# Patient Record
Sex: Female | Born: 1990 | Race: Black or African American | Hispanic: No | Marital: Single | Smoking: Never smoker
Health system: Southern US, Community
[De-identification: ages and names within clinical notes are randomized; demographics above are authoritative.]

## PROBLEM LIST (undated history)

## (undated) ENCOUNTER — Inpatient Hospital Stay (HOSPITAL_COMMUNITY): Payer: Self-pay

## (undated) DIAGNOSIS — E282 Polycystic ovarian syndrome: Secondary | ICD-10-CM

## (undated) DIAGNOSIS — Z789 Other specified health status: Secondary | ICD-10-CM

## (undated) DIAGNOSIS — J45909 Unspecified asthma, uncomplicated: Secondary | ICD-10-CM

## (undated) DIAGNOSIS — F419 Anxiety disorder, unspecified: Secondary | ICD-10-CM

## (undated) HISTORY — PX: WISDOM TOOTH EXTRACTION: SHX21

---

## 2010-11-03 ENCOUNTER — Emergency Department (HOSPITAL_COMMUNITY)
Admission: EM | Admit: 2010-11-03 | Discharge: 2010-11-03 | Disposition: A | Discharge status: Home / Self Care | Payer: PRIVATE HEALTH INSURANCE | Attending: Emergency Medicine | Admitting: Emergency Medicine

## 2010-11-03 ENCOUNTER — Emergency Department (HOSPITAL_COMMUNITY): Payer: PRIVATE HEALTH INSURANCE

## 2010-11-03 DIAGNOSIS — R109 Unspecified abdominal pain: Secondary | ICD-10-CM | POA: Insufficient documentation

## 2010-11-03 DIAGNOSIS — K5900 Constipation, unspecified: Secondary | ICD-10-CM | POA: Insufficient documentation

## 2010-11-03 DIAGNOSIS — E282 Polycystic ovarian syndrome: Secondary | ICD-10-CM | POA: Insufficient documentation

## 2010-11-03 LAB — DIFFERENTIAL
Basophils Absolute: 0 10*3/uL (ref 0.0–0.1)
Basophils Relative: 0 % (ref 0–1)
Eosinophils Absolute: 0.2 10*3/uL (ref 0.0–0.7)
Neutro Abs: 6.4 10*3/uL (ref 1.7–7.7)
Neutrophils Relative %: 58 % (ref 43–77)

## 2010-11-03 LAB — COMPREHENSIVE METABOLIC PANEL
ALT: 39 U/L — ABNORMAL HIGH (ref 0–35)
AST: 26 U/L (ref 0–37)
Albumin: 3.1 g/dL — ABNORMAL LOW (ref 3.5–5.2)
CO2: 26 mEq/L (ref 19–32)
Chloride: 105 mEq/L (ref 96–112)
GFR calc Af Amer: >60 mL/min (ref >60)
GFR calc non Af Amer: >60 mL/min (ref >60)
Sodium: 139 mEq/L (ref 135–145)
Total Bilirubin: 0.2 mg/dL — ABNORMAL LOW (ref 0.3–1.2)

## 2010-11-03 LAB — URINALYSIS, ROUTINE W REFLEX MICROSCOPIC
Bilirubin Urine: NEGATIVE
Hgb urine dipstick: NEGATIVE
Specific Gravity, Urine: 1.02 (ref 1.005–1.030)
Urine Glucose, Fasting: NEGATIVE mg/dL
Urobilinogen, UA: 0.2 mg/dL (ref 0.0–1.0)
pH: 7 (ref 5.0–8.0)

## 2010-11-03 LAB — WET PREP, GENITAL
Clue Cells Wet Prep HPF POC: NONE SEEN
Trich, Wet Prep: NONE SEEN

## 2010-11-03 LAB — CBC
Hemoglobin: 11.6 g/dL — ABNORMAL LOW (ref 12.0–15.0)
Platelets: 357 10*3/uL (ref 150–400)
RBC: 4.24 MIL/uL (ref 3.87–5.11)
WBC: 11 10*3/uL — ABNORMAL HIGH (ref 4.0–10.5)

## 2010-11-04 LAB — GC/CHLAMYDIA PROBE AMP, GENITAL: Chlamydia, DNA Probe: POSITIVE — AB

## 2010-11-09 ENCOUNTER — Other Ambulatory Visit: Payer: Self-pay | Admitting: Internal Medicine

## 2010-11-09 DIAGNOSIS — R102 Pelvic and perineal pain: Secondary | ICD-10-CM

## 2010-11-11 ENCOUNTER — Ambulatory Visit
Admission: RE | Admit: 2010-11-11 | Discharge: 2010-11-11 | Disposition: A | Discharge status: Home / Self Care | Payer: PRIVATE HEALTH INSURANCE | Source: Ambulatory Visit | Attending: Internal Medicine | Admitting: Internal Medicine

## 2010-11-11 ENCOUNTER — Other Ambulatory Visit: Payer: Self-pay | Admitting: Internal Medicine

## 2010-11-11 DIAGNOSIS — R102 Pelvic and perineal pain: Secondary | ICD-10-CM

## 2010-11-12 ENCOUNTER — Other Ambulatory Visit: Payer: PRIVATE HEALTH INSURANCE

## 2011-05-03 IMAGING — US US PELVIS COMPLETE
1 series · 13 of 25 positions shown · non-contrast
Comparison: None.

CLINICAL DATA: Irregular cycles for years.  On oral birth control.
LMP 09/15/2010.  Pelvic pain



[Series 1: us pelvis complete · 0.31mm/px · 13 of 46 slices shown]
[im 1/46]
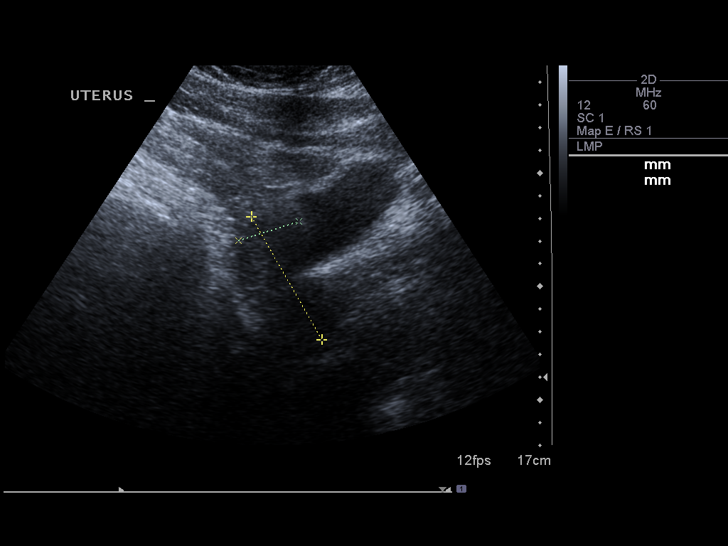
[im 4/46]
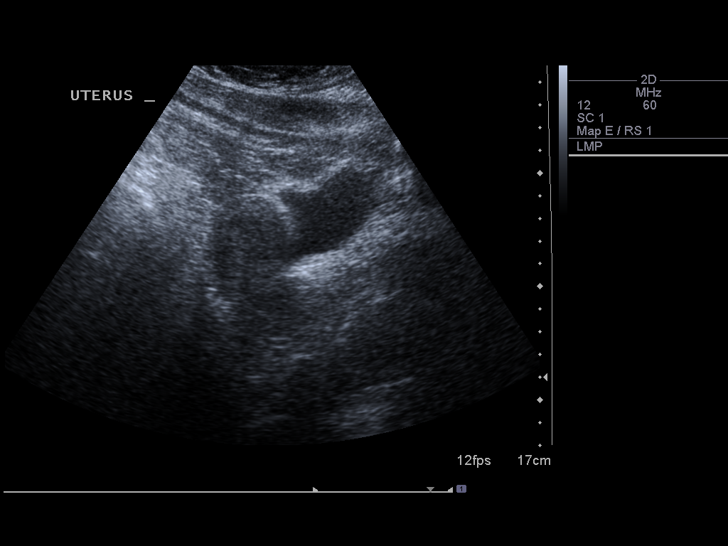
[im 8/46]
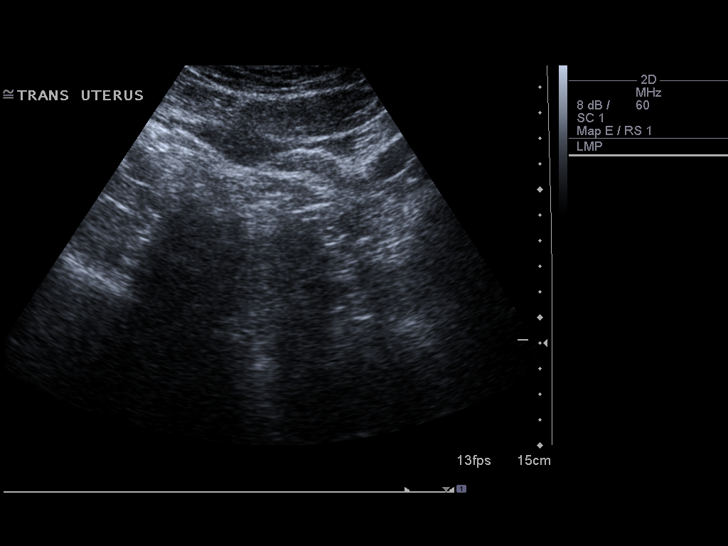
[im 12/46]
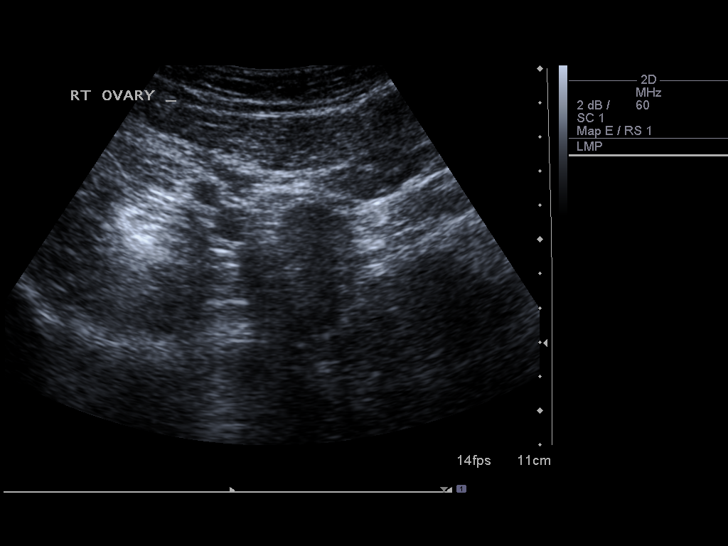
[im 16/46]
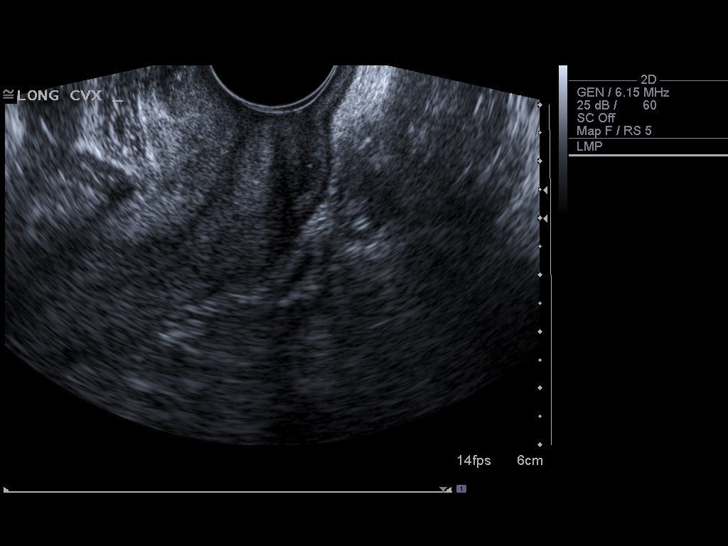
[im 19/46]
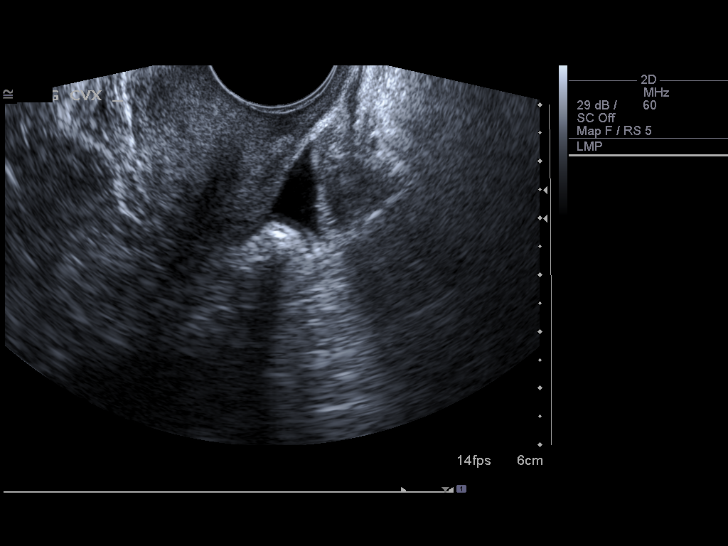
[im 23/46]
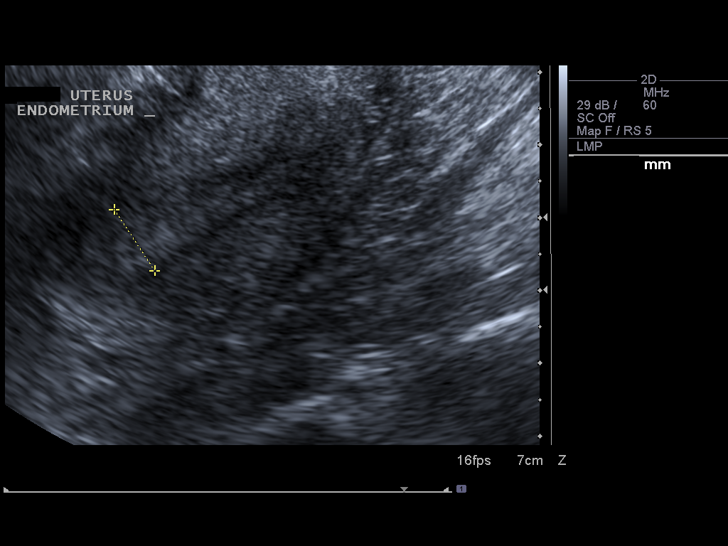
[im 27/46]
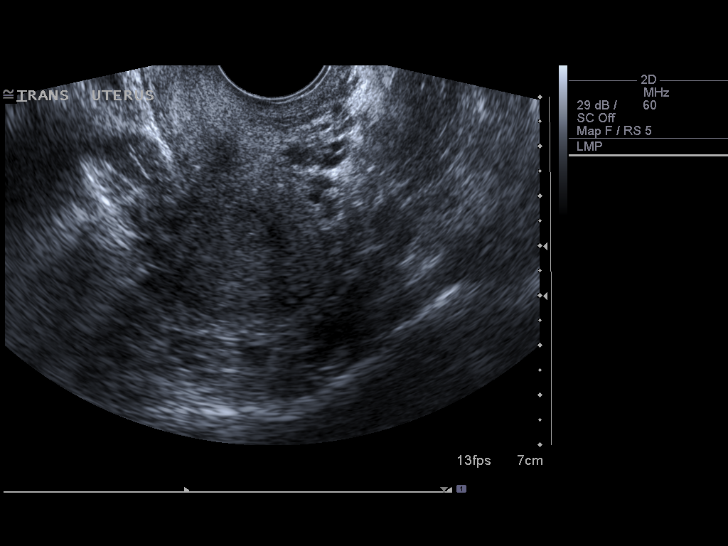
[im 31/46]
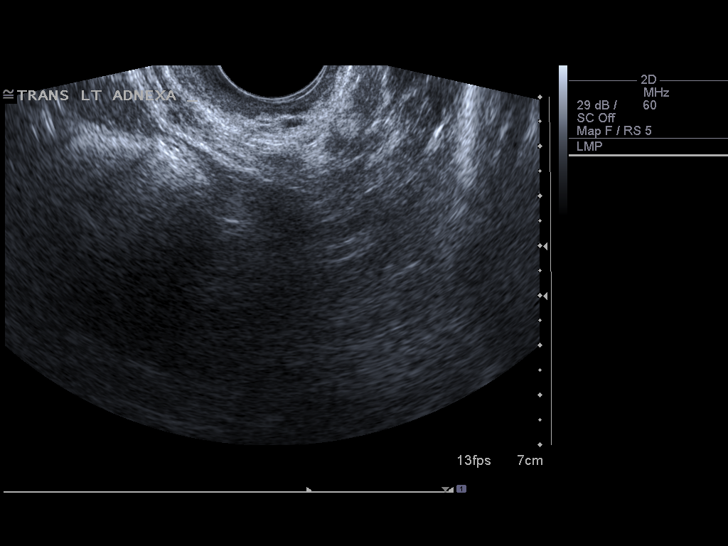
[im 34/46]
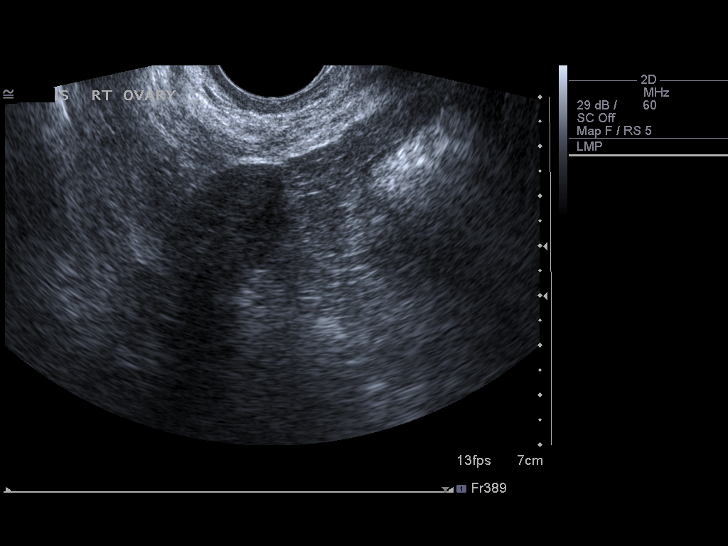
[im 38/46]
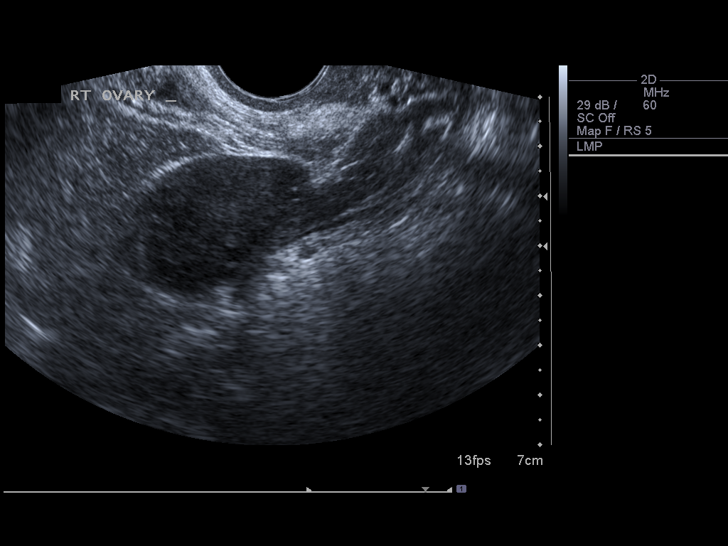
[im 42/46]
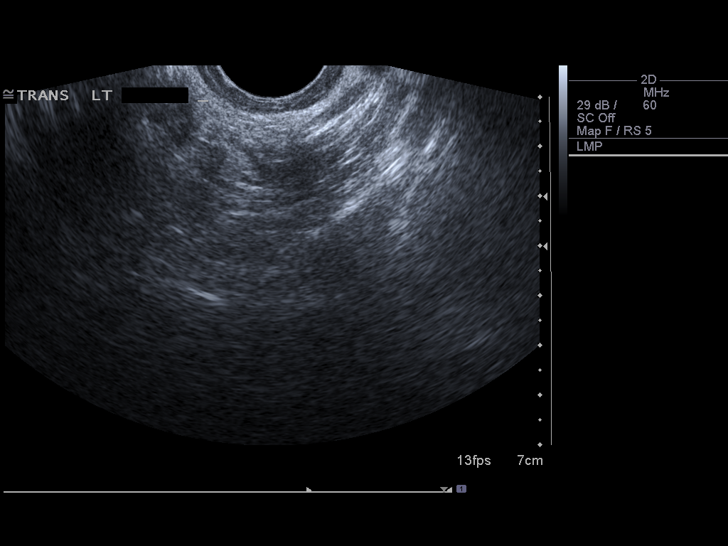
[im 46/46]
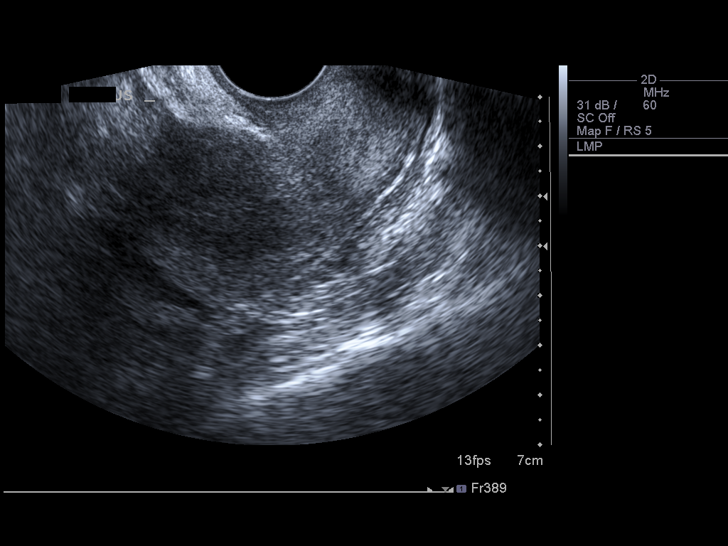

[13 of 25 positions shown; findings below may reference images not displayed]

FINDINGS: Uterus the uterus has a sagittal length of 6.0 cm, an AP depth of
2.8 cm and a transverse width of 5.1 cm.  A homogeneous uterine
myometrium is seen.

Endometrium

The endometrial stripe is homogeneously echogenic with an AP width
of 10.1 mm.  This would correlate with a presecretory endometrial
stripe and correspond with the given LMP of 09/15/2010.  No focal
thickening or areas of inhomogeneity are noted.

Right Ovary measures 4.3 x 2.3 by 2.2 cm and contains a few
scattered sub centimeter follicles.

Left Ovary is not seen with confidence either transabdominally or
endovaginally.

Other Findings:  No pelvic fluid or separate adnexal masses are
seen.
IMPRESSION: Non-visualized left ovary.  Otherwise unremarkable pelvic
ultrasound.

## 2011-12-19 ENCOUNTER — Emergency Department (HOSPITAL_COMMUNITY)
Admission: EM | Admit: 2011-12-19 | Discharge: 2011-12-19 | Disposition: A | Discharge status: Home / Self Care | Payer: PRIVATE HEALTH INSURANCE | Attending: Emergency Medicine | Admitting: Emergency Medicine

## 2011-12-19 ENCOUNTER — Emergency Department (HOSPITAL_COMMUNITY): Payer: PRIVATE HEALTH INSURANCE

## 2011-12-19 ENCOUNTER — Encounter (HOSPITAL_COMMUNITY): Payer: Self-pay

## 2011-12-19 DIAGNOSIS — R1011 Right upper quadrant pain: Secondary | ICD-10-CM | POA: Insufficient documentation

## 2011-12-19 DIAGNOSIS — R112 Nausea with vomiting, unspecified: Secondary | ICD-10-CM | POA: Insufficient documentation

## 2011-12-19 DIAGNOSIS — R197 Diarrhea, unspecified: Secondary | ICD-10-CM | POA: Insufficient documentation

## 2011-12-19 LAB — COMPREHENSIVE METABOLIC PANEL
ALT: 63 U/L — ABNORMAL HIGH (ref 0–35)
Alkaline Phosphatase: 82 U/L (ref 39–117)
BUN: 9 mg/dL (ref 6–23)
CO2: 25 mEq/L (ref 19–32)
GFR calc Af Amer: >90 mL/min (ref >90)
GFR calc non Af Amer: >90 mL/min (ref >90)
Glucose, Bld: 95 mg/dL (ref 70–99)
Potassium: 3.9 mEq/L (ref 3.5–5.1)
Sodium: 136 mEq/L (ref 135–145)
Total Bilirubin: 0.3 mg/dL (ref 0.3–1.2)

## 2011-12-19 LAB — CBC
HCT: 37.2 % (ref 36.0–46.0)
Hemoglobin: 12.5 g/dL (ref 12.0–15.0)
MCHC: 33.6 g/dL (ref 30.0–36.0)
RBC: 4.44 MIL/uL (ref 3.87–5.11)

## 2011-12-19 LAB — URINALYSIS, ROUTINE W REFLEX MICROSCOPIC
Bilirubin Urine: NEGATIVE
Glucose, UA: NEGATIVE mg/dL
Ketones, ur: NEGATIVE mg/dL
Nitrite: NEGATIVE
Specific Gravity, Urine: 1.021 (ref 1.005–1.030)
pH: 6.5 (ref 5.0–8.0)

## 2011-12-19 LAB — LIPASE, BLOOD: Lipase: 36 U/L (ref 11–59)

## 2011-12-19 LAB — URINE MICROSCOPIC-ADD ON

## 2011-12-19 MED ORDER — SODIUM CHLORIDE 0.9 % IV BOLUS (SEPSIS)
1000.0000 mL | Freq: Once | INTRAVENOUS | Status: AC
  Administered 2011-12-19: 1000 mL via INTRAVENOUS

## 2011-12-19 MED ORDER — MORPHINE SULFATE 4 MG/ML IJ SOLN
4.0000 mg | Freq: Once | INTRAMUSCULAR | Status: AC
  Administered 2011-12-19: 4 mg via INTRAVENOUS
  Filled 2011-12-19: qty 1

## 2011-12-19 MED ORDER — ONDANSETRON HCL 4 MG PO TABS: 4.0000 mg | ORAL_TABLET | Freq: Four times a day (QID) | ORAL | Status: AC

## 2011-12-19 MED ORDER — ONDANSETRON HCL 4 MG/2ML IJ SOLN
4.0000 mg | Freq: Once | INTRAMUSCULAR | Status: AC
  Administered 2011-12-19: 4 mg via INTRAVENOUS
  Filled 2011-12-19: qty 2

## 2011-12-19 MED ORDER — RANITIDINE HCL 150 MG PO TABS: 150.0000 mg | ORAL_TABLET | Freq: Two times a day (BID) | ORAL | Status: AC

## 2011-12-19 NOTE — ED Notes (Signed)
EDP at bedside speaking with pt and family 

## 2011-12-19 NOTE — ED Provider Notes (Signed)
History     CSN: [id number]  Arrival date & time 12/19/11  2536   First MD Initiated Contact with Patient 12/19/11 [phone]      Chief Complaint  Patient presents with  . Abdominal Pain    (Consider location/radiation/quality/duration/timing/severity/associated sxs/prior treatment) HPI Pt presents with c/o right upper abdominal pain with associated vomiting and diarrhea.  Symptoms began 2 nights ago, RUQ pain worsened las night.  No fever/chills.  Pt states this morning she has not been able to keep down liquids.  Denies dysuria, no vaginal discharge.  No pain in pelvis or lower abdomen.  There are no other associated systemic symptoms.  There are no alleviating or modifying factors.  Pt does not know of any specific sick contacts.   History reviewed. No pertinent past medical history.  History reviewed. No pertinent past surgical history.  History reviewed. No pertinent family history.  History  Substance Use Topics  . Smoking status: Never Smoker   . Smokeless tobacco: Not on file  . Alcohol Use: Yes     occasional    OB History    Grav Para Term Preterm Abortions TAB SAB Ect Mult Living   0               Review of Systems ROS reviewed and otherwise negative except for mentioned in HPI  Allergies  Eggs or egg-derived products  Home Medications   Current Outpatient Rx  Name Route Sig Dispense Refill  . ADULT MULTIVITAMIN W/MINERALS CH Oral Take 1 tablet by mouth daily.    Marland Kitchen NORGESTIMATE-ETH ESTRADIOL 0.25-35 MG-MCG PO TABS Oral Take 1 tablet by mouth daily.    Marland Kitchen ONDANSETRON HCL 4 MG PO TABS Oral Take 1 tablet (4 mg total) by mouth every 6 (six) hours. 12 tablet 0  . PHENTERMINE HCL 37.5 MG PO CAPS Oral Take 37.5 mg by mouth every morning.    Marland Kitchen RANITIDINE HCL 150 MG PO TABS Oral Take 1 tablet (150 mg total) by mouth 2 (two) times daily. 60 tablet 0    BP 100/46  Pulse 84  Temp(Src) 98 F (36.7 C) (Oral)  Resp 20  SpO2 99%  LMP 09/17/2011 Vitals  reviewed Physical Exam Physical Examination: General appearance - alert, well appearing, and in no distress Mental status - alert, oriented to person, place, and time Eyes - pupils equal and reactive, extraocular eye movements intact Mouth - mucous membranes moist, pharynx normal without lesions Chest - clear to auscultation, no wheezes, rales or rhonchi, symmetric air entry Heart - normal rate, regular rhythm, normal S1, S2, no murmurs, rubs, clicks or gallops Abdomen - soft, mild ttp in epigastrium and RUQ, no gaurding or rebound tenderness, nondistended, no masses or organomegaly, nabs Extremities - peripheral pulses normal, no pedal edema, no clubbing or cyanosis Skin - normal coloration and turgor, no rashes  ED Course  Procedures (including critical care time)  11:46 AM pt tolerating fluids and requesting food.  Will discharge home.   Labs Reviewed  URINALYSIS, ROUTINE W REFLEX MICROSCOPIC - Abnormal; Notable for the following:    APPearance CLOUDY (*)    Hgb urine dipstick TRACE (*)    Leukocytes, UA MODERATE (*)    All other components within normal limits  COMPREHENSIVE METABOLIC PANEL - Abnormal; Notable for the following:    Albumin 3.1 (*)    AST 39 (*)    ALT 63 (*)    All other components within normal limits  URINE MICROSCOPIC-ADD ON - Abnormal; Notable  for the following:    Squamous Epithelial / LPF FEW (*)    Bacteria, UA FEW (*)    All other components within normal limits  CBC  LIPASE, BLOOD  POCT PREGNANCY, URINE  LAB REPORT - SCANNED   No results found.   1. Abdominal pain   2. Nausea vomiting and diarrhea       MDM  Pt presenting with c/o RUQ abdominal pain with nausea/vomiting/diarrhea.  Ultrasound reveals no gallstones or cholecystitis.  Pt feels improved after IV hydration and antiemetics.  I have a low suspicion for other acute intraabdominal process at this time.   Suspect gastroenteritis.  Pt discharged with strict return precautions, she is  agreeable with this plan.         Ethelda Chick, MD 12/21/11 [phone]

## 2011-12-19 NOTE — Discharge Instructions (Signed)
Return to the ED with any concerns including vomiting and not able to keep down liquids, worsening abdominal pain, fever/chills, or any other alarming symptoms

## 2011-12-19 NOTE — ED Notes (Signed)
Pt reports abdominal pain after drinking sprite, no vomiting.

## 2011-12-19 NOTE — ED Notes (Signed)
Pt with c/o pain in the right abdomen, had nausea, vomiting and diarrhea Saturday, Right abdominal pain worsened Saturday night

## 2012-06-09 IMAGING — US US ABDOMEN COMPLETE
1 series · 13 of 25 positions shown · non-contrast
Comparison: Right upper quadrant abdominal ultrasound 01/14/2009
Shaiene Gazola.

CLINICAL DATA: Right upper quadrant abdominal pain.  Nausea and
vomiting.

COMPLETE ABDOMINAL ULTRASOUND 12/19/2011:

[Series 1: us abdomen complete · 0.36mm/px · 13 of 51 slices shown]
[im 1/51]
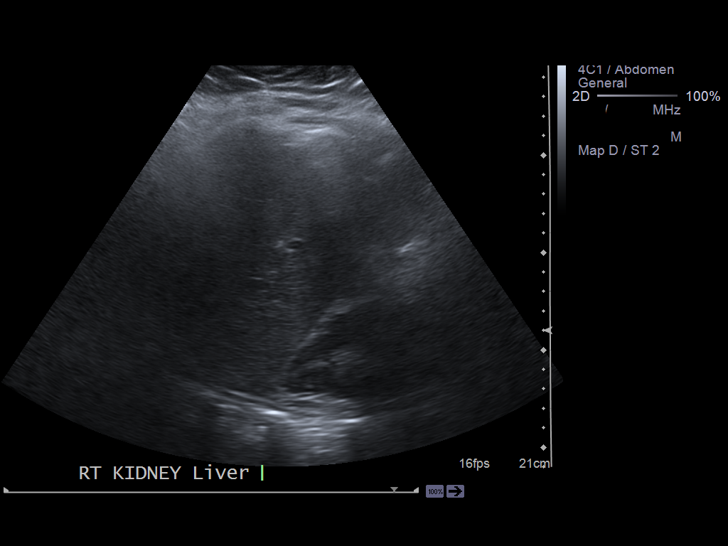
[im 5/51]
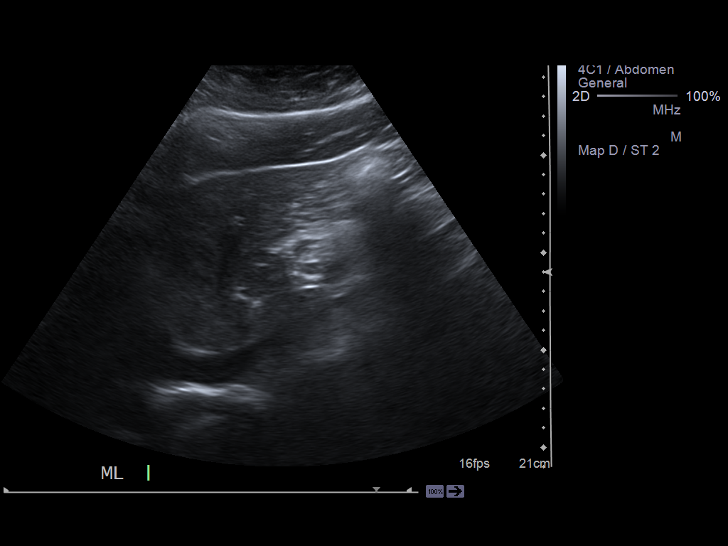
[im 9/51]
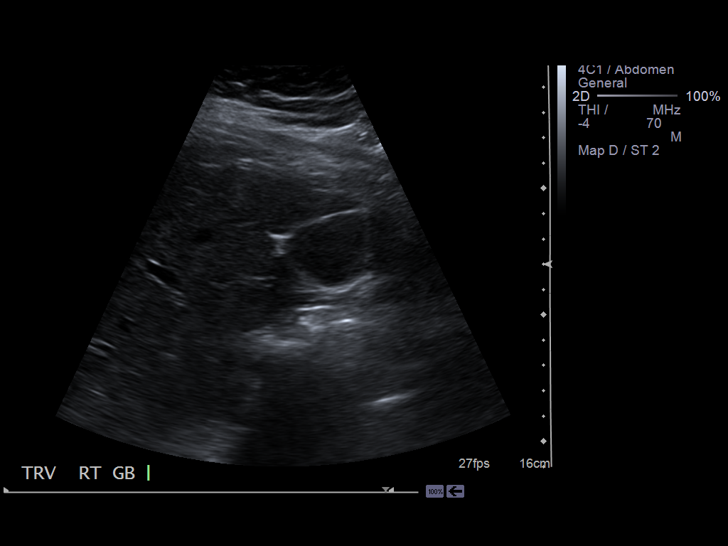
[im 13/51]
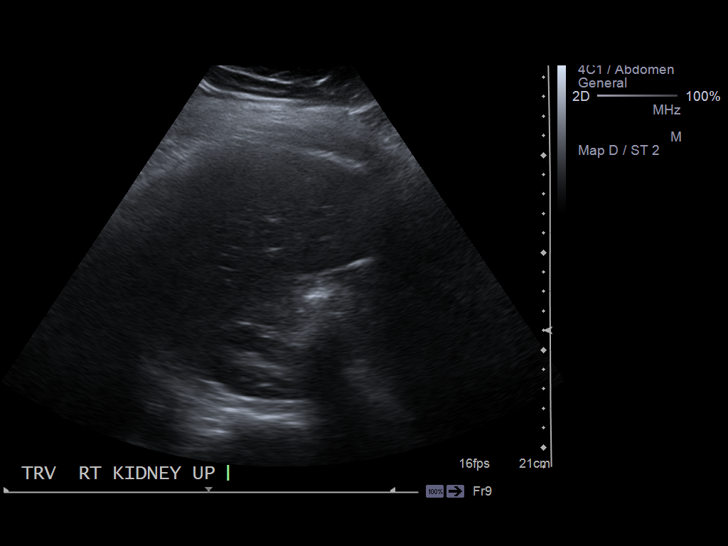
[im 17/51]
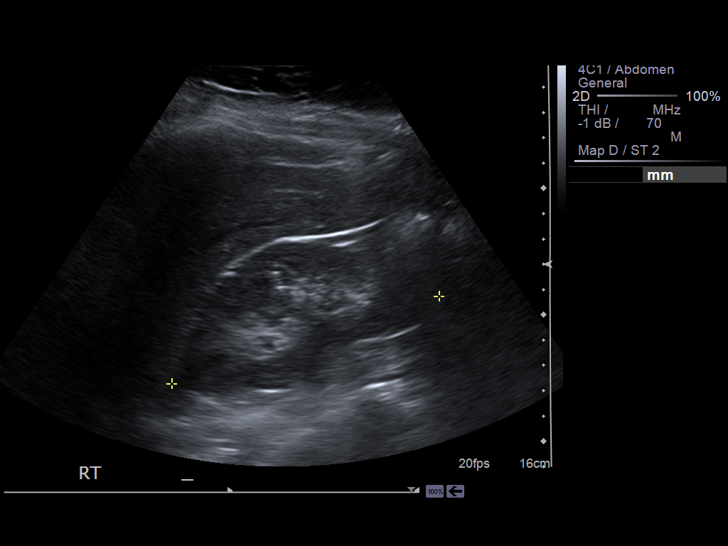
[im 21/51]
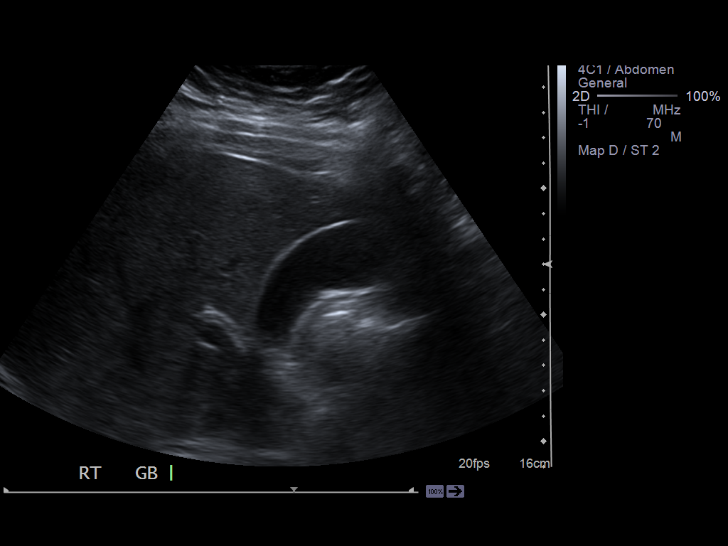
[im 26/51]
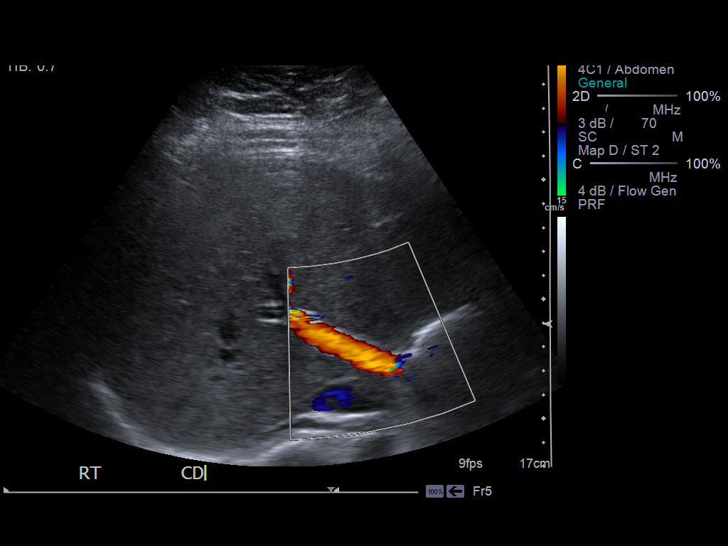
[im 30/51]
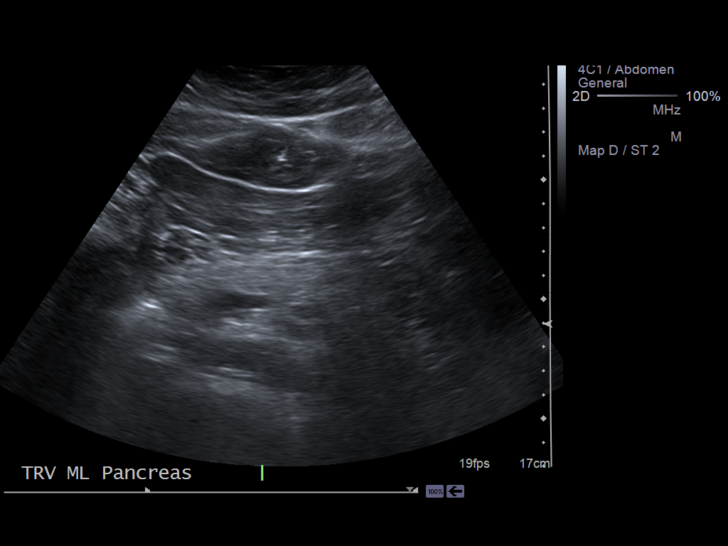
[im 34/51]
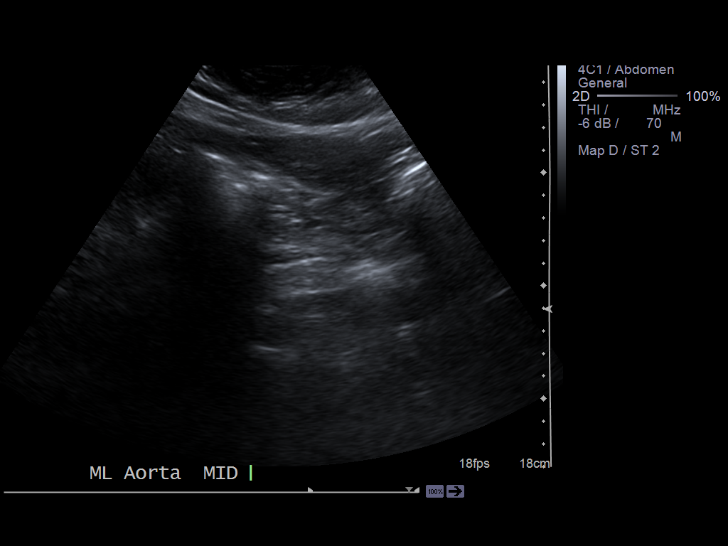
[im 38/51]
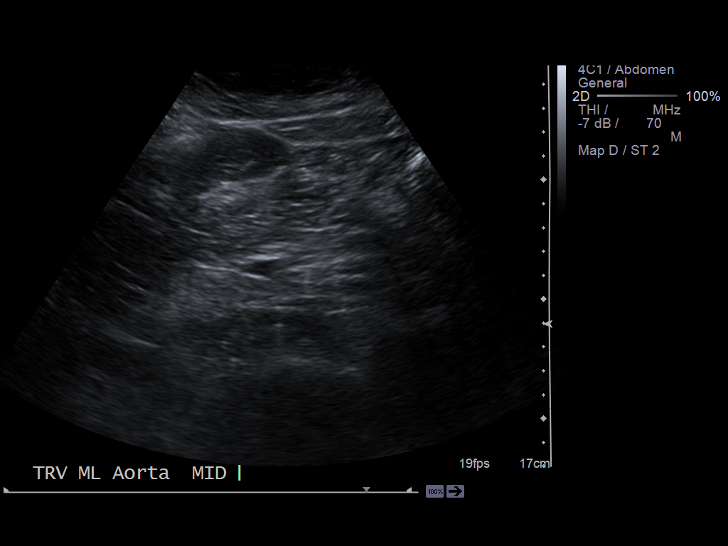
[im 42/51]
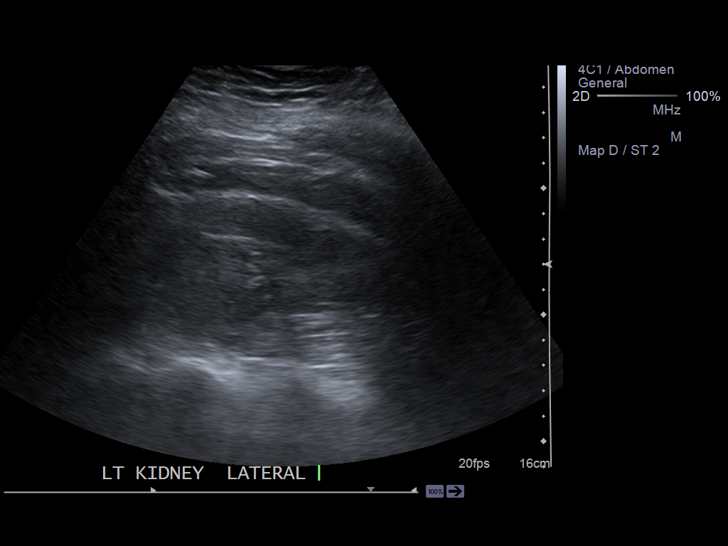
[im 46/51]
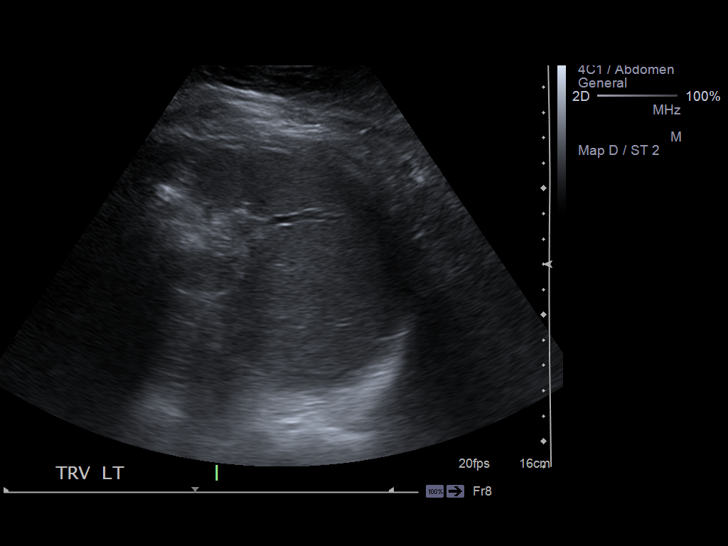
[im 51/51]
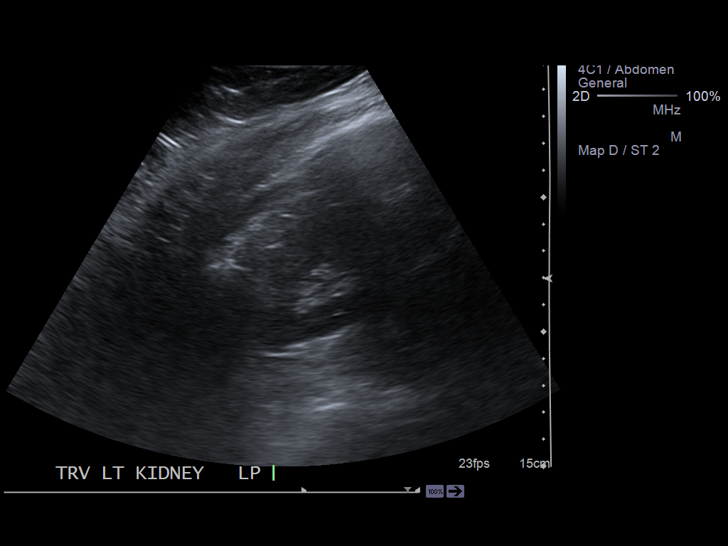

[13 of 25 positions shown; findings below may reference images not displayed]

FINDINGS: Gallbladder:  No shadowing gallstones or echogenic sludge.  No
gallbladder wall thickening or pericholecystic fluid.  Negative
sonographic Murphy's sign according to the ultrasound technologist.

Common bile duct:  Normal in caliber with maximum diameter
approximating 4 mm.

Liver:  Normal size and echotexture without focal parenchymal
abnormality.  Patent portal vein with hepatopetal flow.

IVC:  Patent.

Pancreas:  Although the pancreas is difficult to visualize in its
entirety, no focal pancreatic abnormality is identified.

Spleen:  Normal size and echotexture without focal parenchymal
abnormality.

Right Kidney:  No hydronephrosis.  Well-preserved cortex.  No
shadowing calculi.  Normal size and parenchymal echotexture without
focal abnormalities.  Approximately 11.1 cm in length.

Left Kidney:  No hydronephrosis.  Well-preserved cortex.  No
shadowing calculi.  Normal size and parenchymal echotexture without
focal abnormalities.  Approximate 11.7 cm in length.

Abdominal aorta:  Normal in caliber throughout its visualized
course in the abdomen without significant atherosclerosis.
IMPRESSION: Normal examination.

## 2012-09-27 HISTORY — PX: LAPAROSCOPIC GASTRIC SLEEVE RESECTION: SHX5895

## 2013-03-29 DIAGNOSIS — G43909 Migraine, unspecified, not intractable, without status migrainosus: Secondary | ICD-10-CM | POA: Insufficient documentation

## 2013-03-29 DIAGNOSIS — R030 Elevated blood-pressure reading, without diagnosis of hypertension: Secondary | ICD-10-CM | POA: Insufficient documentation

## 2014-04-30 DIAGNOSIS — K910 Vomiting following gastrointestinal surgery: Secondary | ICD-10-CM | POA: Insufficient documentation

## 2014-04-30 DIAGNOSIS — Z903 Acquired absence of stomach [part of]: Secondary | ICD-10-CM | POA: Insufficient documentation

## 2014-05-09 DIAGNOSIS — E519 Thiamine deficiency, unspecified: Secondary | ICD-10-CM | POA: Insufficient documentation

## 2015-09-28 NOTE — L&D Delivery Note (Addendum)
Operative Delivery Note At 10:21 AM a viable female was delivered via Vaginal, Vacuum Investment banker, operational(Extractor).  Presentation: LOA; Position: Left,, Occiput,, Anterior; Station: +3. Pt was prepped and draped for CS.  She had bloody urine so I checked her.  I offered her VAVD because her epidural had just been dosed.   one pull used.  No pop offs occurred.  The pressure was maintained in the green zone.    Verbal consent: obtained from patient.  Risks and benefits discussed in detail.  Risks include, but are not limited to the risks of anesthesia, bleeding, infection, damage to maternal tissues, fetal cephalhematoma.  There is also the risk of inability to effect vaginal delivery of the head, or shoulder dystocia that cannot be resolved by established maneuvers, leading to the need for emergency cesarean section.  APGAR: , ; weight  .   Placenta status: , .   Cord:  with the following complications: .  Cord pH: 7.34 and 7.35  Anesthesia:  Epidural Instruments: vacuum Episiotomy:   Lacerations:  Second degree Suture Repair: 2.0 chromic and 0 vicryl Est. Blood Loss (mL):  300 cc  Mom to postpartum.  Baby to Couplet care / Skin to Skin.  Krista Solis A 06/12/2016, 11:05 AM

## 2015-10-27 ENCOUNTER — Inpatient Hospital Stay (HOSPITAL_COMMUNITY): Payer: Medicaid Other

## 2015-10-27 ENCOUNTER — Inpatient Hospital Stay (HOSPITAL_COMMUNITY)
Admission: AD | Admit: 2015-10-27 | Discharge: 2015-10-27 | Disposition: A | Discharge status: Home / Self Care | Payer: Medicaid Other | Source: Ambulatory Visit | Attending: Family Medicine | Admitting: Family Medicine

## 2015-10-27 ENCOUNTER — Encounter (HOSPITAL_COMMUNITY): Payer: Self-pay | Admitting: *Deleted

## 2015-10-27 DIAGNOSIS — Z331 Pregnant state, incidental: Secondary | ICD-10-CM

## 2015-10-27 DIAGNOSIS — Z3490 Encounter for supervision of normal pregnancy, unspecified, unspecified trimester: Secondary | ICD-10-CM

## 2015-10-27 DIAGNOSIS — O4691 Antepartum hemorrhage, unspecified, first trimester: Secondary | ICD-10-CM | POA: Diagnosis not present

## 2015-10-27 DIAGNOSIS — R109 Unspecified abdominal pain: Secondary | ICD-10-CM | POA: Diagnosis not present

## 2015-10-27 DIAGNOSIS — O9989 Other specified diseases and conditions complicating pregnancy, childbirth and the puerperium: Secondary | ICD-10-CM | POA: Diagnosis not present

## 2015-10-27 DIAGNOSIS — O26899 Other specified pregnancy related conditions, unspecified trimester: Secondary | ICD-10-CM

## 2015-10-27 DIAGNOSIS — Z3A01 Less than 8 weeks gestation of pregnancy: Secondary | ICD-10-CM | POA: Insufficient documentation

## 2015-10-27 DIAGNOSIS — O209 Hemorrhage in early pregnancy, unspecified: Secondary | ICD-10-CM

## 2015-10-27 HISTORY — DX: Polycystic ovarian syndrome: E28.2

## 2015-10-27 LAB — CBC
HCT: 37.9 % (ref 36.0–46.0)
Hemoglobin: 12.6 g/dL (ref 12.0–15.0)
MCH: 29 pg (ref 26.0–34.0)
MCHC: 33.2 g/dL (ref 30.0–36.0)
MCV: 87.1 fL (ref 78.0–100.0)
PLATELETS: 288 10*3/uL (ref 150–400)
RBC: 4.35 MIL/uL (ref 3.87–5.11)
RDW: 12.6 % (ref 11.5–15.5)
WBC: 5.9 10*3/uL (ref 4.0–10.5)

## 2015-10-27 LAB — URINALYSIS, ROUTINE W REFLEX MICROSCOPIC
BILIRUBIN URINE: NEGATIVE
Glucose, UA: NEGATIVE mg/dL
KETONES UR: NEGATIVE mg/dL
Leukocytes, UA: NEGATIVE
NITRITE: NEGATIVE
Protein, ur: NEGATIVE mg/dL
Specific Gravity, Urine: <1.005 — ABNORMAL LOW (ref 1.005–1.030)
pH: 7 (ref 5.0–8.0)

## 2015-10-27 LAB — OB RESULTS CONSOLE GC/CHLAMYDIA: GC PROBE AMP, GENITAL: NEGATIVE

## 2015-10-27 LAB — HCG, QUANTITATIVE, PREGNANCY: hCG, Beta Chain, Quant, S: 49616 m[IU]/mL — ABNORMAL HIGH (ref <5)

## 2015-10-27 LAB — ABO/RH: ABO/RH(D): B POS

## 2015-10-27 LAB — POCT PREGNANCY, URINE: PREG TEST UR: POSITIVE — AB

## 2015-10-27 LAB — WET PREP, GENITAL
Clue Cells Wet Prep HPF POC: NONE SEEN
SPERM: NONE SEEN
Trich, Wet Prep: NONE SEEN
YEAST WET PREP: NONE SEEN

## 2015-10-27 LAB — URINE MICROSCOPIC-ADD ON

## 2015-10-27 NOTE — Discharge Instructions (Signed)
First Trimester of Pregnancy °The first trimester of pregnancy is from week 1 until the end of week 12 (months 1 through 3). A week after a sperm fertilizes an egg, the egg will implant on the wall of the uterus. This embryo will begin to develop into a baby. Genes from you and your partner are forming the baby. The female genes determine whether the baby is a boy or a girl. At 6-8 weeks, the eyes and face are formed, and the heartbeat can be seen on ultrasound. At the end of 12 weeks, all the baby's organs are formed.  °Now that you are pregnant, you will want to do everything you can to have a healthy baby. Two of the most important things are to get good prenatal care and to follow your health care provider's instructions. Prenatal care is all the medical care you receive before the baby's birth. This care will help prevent, find, and treat any problems during the pregnancy and childbirth. °BODY CHANGES °Your body goes through many changes during pregnancy. The changes vary from woman to woman.  °· You may gain or lose a couple of pounds at first. °· You may feel sick to your stomach (nauseous) and throw up (vomit). If the vomiting is uncontrollable, call your health care provider. °· You may tire easily. °· You may develop headaches that can be relieved by medicines approved by your health care provider. °· You may urinate more often. Painful urination may mean you have a bladder infection. °· You may develop heartburn as a result of your pregnancy. °· You may develop constipation because certain hormones are causing the muscles that push waste through your intestines to slow down. °· You may develop hemorrhoids or swollen, bulging veins (varicose veins). °· Your breasts may begin to grow larger and become tender. Your nipples may stick out more, and the tissue that surrounds them (areola) may become darker. °· Your gums may bleed and may be sensitive to brushing and flossing. °· Dark spots or blotches (chloasma,  mask of pregnancy) may develop on your face. This will likely fade after the baby is born. °· Your menstrual periods will stop. °· You may have a loss of appetite. °· You may develop cravings for certain kinds of food. °· You may have changes in your emotions from day to day, such as being excited to be pregnant or being concerned that something may go wrong with the pregnancy and baby. °· You may have more vivid and strange dreams. °· You may have changes in your hair. These can include thickening of your hair, rapid growth, and changes in texture. Some women also have hair loss during or after pregnancy, or hair that feels dry or thin. Your hair will most likely return to normal after your baby is born. °WHAT TO EXPECT AT YOUR PRENATAL VISITS °During a routine prenatal visit: °· You will be weighed to make sure you and the baby are growing normally. °· Your blood pressure will be taken. °· Your abdomen will be measured to track your baby's growth. °· The fetal heartbeat will be listened to starting around week 10 or 12 of your pregnancy. °· Test results from any previous visits will be discussed. °Your health care provider may ask you: °· How you are feeling. °· If you are feeling the baby move. °· If you have had any abnormal symptoms, such as leaking fluid, bleeding, severe headaches, or abdominal cramping. °· If you are using any tobacco products,   including cigarettes, chewing tobacco, and electronic cigarettes. °· If you have any questions. °Other tests that may be performed during your first trimester include: °· Blood tests to find your blood type and to check for the presence of any previous infections. They will also be used to check for low iron levels (anemia) and Rh antibodies. Later in the pregnancy, blood tests for diabetes will be done along with other tests if problems develop. °· Urine tests to check for infections, diabetes, or protein in the urine. °· An ultrasound to confirm the proper growth  and development of the baby. °· An amniocentesis to check for possible genetic problems. °· Fetal screens for spina bifida and Down syndrome. °· You may need other tests to make sure you and the baby are doing well. °· HIV (human immunodeficiency virus) testing. Routine prenatal testing includes screening for HIV, unless you choose not to have this test. °HOME CARE INSTRUCTIONS  °Medicines °· Follow your health care provider's instructions regarding medicine use. Specific medicines may be either safe or unsafe to take during pregnancy. °· Take your prenatal vitamins as directed. °· If you develop constipation, try taking a stool softener if your health care provider approves. °Diet °· Eat regular, well-balanced meals. Choose a variety of foods, such as meat or vegetable-based protein, fish, milk and low-fat dairy products, vegetables, fruits, and whole grain breads and cereals. Your health care provider will help you determine the amount of weight gain that is right for you. °· Avoid raw meat and uncooked cheese. These carry germs that can cause birth defects in the baby. °· Eating four or five small meals rather than three large meals a day may help relieve nausea and vomiting. If you start to feel nauseous, eating a few soda crackers can be helpful. Drinking liquids between meals instead of during meals also seems to help nausea and vomiting. °· If you develop constipation, eat more high-fiber foods, such as fresh vegetables or fruit and whole grains. Drink enough fluids to keep your urine clear or pale yellow. °Activity and Exercise °· Exercise only as directed by your health care provider. Exercising will help you: °¨ Control your weight. °¨ Stay in shape. °¨ Be prepared for labor and delivery. °· Experiencing pain or cramping in the lower abdomen or low back is a good sign that you should stop exercising. Check with your health care provider before continuing normal exercises. °· Try to avoid standing for long  periods of time. Move your legs often if you must stand in one place for a long time. °· Avoid heavy lifting. °· Wear low-heeled shoes, and practice good posture. °· You may continue to have sex unless your health care provider directs you otherwise. °Relief of Pain or Discomfort °· Wear a good support bra for breast tenderness.   °· Take warm sitz baths to soothe any pain or discomfort caused by hemorrhoids. Use hemorrhoid cream if your health care provider approves.   °· Rest with your legs elevated if you have leg cramps or low back pain. °· If you develop varicose veins in your legs, wear support hose. Elevate your feet for 15 minutes, 3-4 times a day. Limit salt in your diet. °Prenatal Care °· Schedule your prenatal visits by the twelfth week of pregnancy. They are usually scheduled monthly at first, then more often in the last 2 months before delivery. °· Write down your questions. Take them to your prenatal visits. °· Keep all your prenatal visits as directed by your   health care provider. °Safety °· Wear your seat belt at all times when driving. °· Make a list of emergency phone numbers, including numbers for family, friends, the hospital, and police and fire departments. °General Tips °· Ask your health care provider for a referral to a local prenatal education class. Begin classes no later than at the beginning of month 6 of your pregnancy. °· Ask for help if you have counseling or nutritional needs during pregnancy. Your health care provider can offer advice or refer you to specialists for help with various needs. °· Do not use hot tubs, steam rooms, or saunas. °· Do not douche or use tampons or scented sanitary pads. °· Do not cross your legs for long periods of time. °· Avoid cat litter boxes and soil used by cats. These carry germs that can cause birth defects in the baby and possibly loss of the fetus by miscarriage or stillbirth. °· Avoid all smoking, herbs, alcohol, and medicines not prescribed by  your health care provider. Chemicals in these affect the formation and growth of the baby. °· Do not use any tobacco products, including cigarettes, chewing tobacco, and electronic cigarettes. If you need help quitting, ask your health care provider. You may receive counseling support and other resources to help you quit. °· Schedule a dentist appointment. At home, brush your teeth with a soft toothbrush and be gentle when you floss. °SEEK MEDICAL CARE IF:  °· You have dizziness. °· You have mild pelvic cramps, pelvic pressure, or nagging pain in the abdominal area. °· You have persistent nausea, vomiting, or diarrhea. °· You have a bad smelling vaginal discharge. °· You have pain with urination. °· You notice increased swelling in your face, hands, legs, or ankles. °SEEK IMMEDIATE MEDICAL CARE IF:  °· You have a fever. °· You are leaking fluid from your vagina. °· You have spotting or bleeding from your vagina. °· You have severe abdominal cramping or pain. °· You have rapid weight gain or loss. °· You vomit blood or material that looks like coffee grounds. °· You are exposed to German measles and have never had them. °· You are exposed to fifth disease or chickenpox. °· You develop a severe headache. °· You have shortness of breath. °· You have any kind of trauma, such as from a fall or a car accident. °  °This information is not intended to replace advice given to you by your health care provider. Make sure you discuss any questions you have with your health care provider. °  °Document Released: 09/07/2001 Document Revised: 10/04/2014 Document Reviewed: 07/24/2013 °Elsevier Interactive Patient Education ©2016 [employer] °Vaginal Bleeding During Pregnancy, First Trimester °A small amount of bleeding (spotting) from the vagina is relatively common in early pregnancy. It usually stops on its own. Various things may cause bleeding or spotting in early pregnancy. Some bleeding may be related to the pregnancy, and  some may not. In most cases, the bleeding is normal and is not a problem. However, bleeding can also be a sign of something serious. Be sure to tell your health care provider about any vaginal bleeding right away. °Some possible causes of vaginal bleeding during the first trimester include: °· Infection or inflammation of the cervix. °· Growths (polyps) on the cervix. °· Miscarriage or threatened miscarriage. °· Pregnancy tissue has developed outside of the uterus and in a fallopian tube (tubal pregnancy). °· Tiny cysts have developed in the uterus instead of pregnancy tissue (molar pregnancy). °HOME CARE INSTRUCTIONS  °  Watch your condition for any changes. The following actions may help to lessen any discomfort you are feeling: °· Follow your health care provider's instructions for limiting your activity. If your health care provider orders bed rest, you may need to stay in bed and only get up to use the bathroom. However, your health care provider may allow you to continue light activity. °· If needed, make plans for someone to help with your regular activities and responsibilities while you are on bed rest. °· Keep track of the number of pads you use each day, how often you change pads, and how soaked (saturated) they are. Write this down. °· Do not use tampons. Do not douche. °· Do not have sexual intercourse or orgasms until approved by your health care provider. °· If you pass any tissue from your vagina, save the tissue so you can show it to your health care provider. °· Only take over-the-counter or prescription medicines as directed by your health care provider. °· Do not take aspirin because it can make you bleed. °· Keep all follow-up appointments as directed by your health care provider. °SEEK MEDICAL CARE IF: °· You have any vaginal bleeding during any part of your pregnancy. °· You have cramps or labor pains. °· You have a fever, not controlled by medicine. °SEEK IMMEDIATE MEDICAL CARE IF:  °· You have  severe cramps in your back or belly (abdomen). °· You pass large clots or tissue from your vagina. °· Your bleeding increases. °· You feel light-headed or weak, or you have fainting episodes. °· You have chills. °· You are leaking fluid or have a gush of fluid from your vagina. °· You pass out while having a bowel movement. °MAKE SURE YOU: °· Understand these instructions. °· Will watch your condition. °· Will get help right away if you are not doing well or get worse. °  °This information is not intended to replace advice given to you by your health care provider. Make sure you discuss any questions you have with your health care provider. °  °Document Released: 06/23/2005 Document Revised: 09/18/2013 Document Reviewed: 05/21/2013 °Elsevier Interactive Patient Education ©2016 [employer] ° °

## 2015-10-27 NOTE — MAU Note (Signed)
preg confirmed at [facility] on Sat

## 2015-10-27 NOTE — MAU Provider Note (Signed)
History     CSN: [id number]  Arrival date and time: 10/27/15 1121   First Provider Initiated Contact with Patient 10/27/15 1241       Chief Complaint  Patient presents with  . Abdominal Cramping  . Vaginal Bleeding   HPI  Krista Solis is a 25 y.o. G1P0 at [redacted]w[redacted]d who presents for abdominal cramping & vaginal bleeding.  Has had vaginal spotting on toilet paper since last night. Started out bright red, is now brown.  Abdominal cramping since this morning. Comes & goes. Rates 5/10 on pain scale. No treatment.  Vomited twice today. Currently no nausea.  Denies diarrhea, constipation, urinary complaints of fever.  Some thin/mucoid white discharge. No odor or irritation.  Last intercourse was 2 weeks ago.      OB History    Gravida Para Term Preterm AB TAB SAB Ectopic Multiple Living   1               Past Medical History  Diagnosis Date  . PCOS (polycystic ovarian syndrome)     Past Surgical History  Procedure Laterality Date  . Laparoscopic gastric sleeve resection  2014  . Wisdom tooth extraction      History reviewed. No pertinent family history.  Social History  Substance Use Topics  . Smoking status: Never Smoker   . Smokeless tobacco: None  . Alcohol Use: Yes     Comment: occasional    Allergies:  Allergies  Allergen Reactions  . Eggs Or Egg-Derived Products     Prescriptions prior to admission  Medication Sig Dispense Refill Last Dose  . Multiple Vitamin (MULITIVITAMIN WITH MINERALS) TABS Take 1 tablet by mouth daily.   12/18/2011 at Unknown  . norgestimate-ethinyl estradiol (ORTHO-CYCLEN,SPRINTEC,PREVIFEM) 0.25-35 MG-MCG tablet Take 1 tablet by mouth daily.   12/18/2011 at Unknown  . phentermine 37.5 MG capsule Take 37.5 mg by mouth every morning.   Given By EMS at Unknown    Review of Systems  Constitutional: Negative.   Gastrointestinal: Positive for vomiting and abdominal pain. Negative for nausea, diarrhea and constipation.  Genitourinary:  Negative for dysuria.       + vaginal bleeding + vaginal discharge   Physical Exam   Blood pressure 126/82, pulse 79, temperature 98.5 F (36.9 C), temperature source Oral, resp. rate 18, weight 264 lb 9.6 oz (120.022 kg), last menstrual period 09/08/2015.  Physical Exam  Nursing note and vitals reviewed. Constitutional: She is oriented to person, place, and time. She appears well-developed and well-nourished. No distress.  HENT:  Head: Normocephalic and atraumatic.  Eyes: Conjunctivae are normal. Right eye exhibits no discharge. Left eye exhibits no discharge. No scleral icterus.  Neck: Normal range of motion.  Cardiovascular: Normal rate, regular rhythm and normal heart sounds.   No murmur heard. Respiratory: Effort normal and breath sounds normal. No respiratory distress. She has no wheezes.  GI: Soft. Bowel sounds are normal. She exhibits no distension. There is no tenderness.  Genitourinary:  Minimal amount of brown discharge Cervix closed  Neurological: She is alert and oriented to person, place, and time.  Skin: Skin is warm and dry. She is not diaphoretic.  Psychiatric: She has a normal mood and affect. Her behavior is normal. Judgment and thought content normal.    MAU Course  Procedures Results for orders placed or performed during the hospital encounter of 10/27/15 (from the past 24 hour(s))  Urinalysis, Routine w reflex microscopic (not at [facility])     Status: Abnormal   Collection  Time: 10/27/15 12:00 PM  Result Value Ref Range   Color, Urine YELLOW YELLOW   APPearance HAZY (A) CLEAR   Specific Gravity, Urine <1.005 (L) 1.005 - 1.030   pH 7.0 5.0 - 8.0   Glucose, UA NEGATIVE NEGATIVE mg/dL   Hgb urine dipstick MODERATE (A) NEGATIVE   Bilirubin Urine NEGATIVE NEGATIVE   Ketones, ur NEGATIVE NEGATIVE mg/dL   Protein, ur NEGATIVE NEGATIVE mg/dL   Nitrite NEGATIVE NEGATIVE   Leukocytes, UA NEGATIVE NEGATIVE  Urine microscopic-add on     Status: Abnormal    Collection Time: 10/27/15 12:00 PM  Result Value Ref Range   Squamous Epithelial / LPF 0-5 (A) NONE SEEN   WBC, UA 0-5 0 - 5 WBC/hpf   RBC / HPF 0-5 0 - 5 RBC/hpf   Bacteria, UA FEW (A) NONE SEEN  Pregnancy, urine POC     Status: Abnormal   Collection Time: 10/27/15 12:03 PM  Result Value Ref Range   Preg Test, Ur POSITIVE (A) NEGATIVE  ABO/Rh     Status: None (Preliminary result)   Collection Time: 10/27/15 12:31 PM  Result Value Ref Range   ABO/RH(D) B POS   hCG, quantitative, pregnancy     Status: Abnormal   Collection Time: 10/27/15 12:42 PM  Result Value Ref Range   hCG, Beta Chain, Quant, S 49616 (H) <5 mIU/mL  CBC     Status: None   Collection Time: 10/27/15 12:43 PM  Result Value Ref Range   WBC 5.9 4.0 - 10.5 K/uL   RBC 4.35 3.87 - 5.11 MIL/uL   Hemoglobin 12.6 12.0 - 15.0 g/dL   HCT 91.4 78.2 - 95.6 %   MCV 87.1 78.0 - 100.0 fL   MCH 29.0 26.0 - 34.0 pg   MCHC 33.2 30.0 - 36.0 g/dL   RDW 21.3 08.6 - 57.8 %   Platelets 288 150 - 400 K/uL  Wet prep, genital     Status: Abnormal   Collection Time: 10/27/15  1:11 PM  Result Value Ref Range   Yeast Wet Prep HPF POC NONE SEEN NONE SEEN   Trich, Wet Prep NONE SEEN NONE SEEN   Clue Cells Wet Prep HPF POC NONE SEEN NONE SEEN   WBC, Wet Prep HPF POC FEW (A) NONE SEEN   Sperm NONE SEEN    US Ob Comp Less 14 Wks  10/27/2015  CLINICAL DATA:  Bleeding. Seven weeks 0 days pregnant by last menstrual. EXAM: OBSTETRIC <14 WK [place] AND TRANSVAGINAL OB US TECHNIQUE: Both transabdominal and transvaginal ultrasound examinations were performed for complete evaluation of the gestation as well as the maternal uterus, adnexal regions, and pelvic cul-de-sac. Transvaginal technique was performed to assess early pregnancy. COMPARISON:  Pelvic ultrasound 11/11/2010. FINDINGS: Intrauterine gestational sac: Present Yolk sac:  Present Embryo:  Present Cardiac Activity: Present Heart Rate: 158  bpm CRL:  13  mm   7 w   4 d Subchorionic hemorrhage:  None  visualized. Maternal uterus/adnexae: Normal appearance of the ovaries. No significant free fluid. IMPRESSION: 1. Intrauterine pregnancy of approximately 7 weeks 4 days. 2. Fetal heart rate of 158 beats per minute. Electronically Signed   By: Jeronimo Greaves M.D.   On: 10/27/2015 13:59   US Ob Transvaginal  10/27/2015  CLINICAL DATA:  Bleeding. Seven weeks 0 days pregnant by last menstrual. EXAM: OBSTETRIC <14 WK [place] AND TRANSVAGINAL OB US TECHNIQUE: Both transabdominal and transvaginal ultrasound examinations were performed for complete evaluation of the gestation as well as  the maternal uterus, adnexal regions, and pelvic cul-de-sac. Transvaginal technique was performed to assess early pregnancy. COMPARISON:  Pelvic ultrasound 11/11/2010. FINDINGS: Intrauterine gestational sac: Present Yolk sac:  Present Embryo:  Present Cardiac Activity: Present Heart Rate: 158  bpm CRL:  13  mm   7 w   4 d Subchorionic hemorrhage:  None visualized. Maternal uterus/adnexae: Normal appearance of the ovaries. No significant free fluid. IMPRESSION: 1. Intrauterine pregnancy of approximately 7 weeks 4 days. 2. Fetal heart rate of 158 beats per minute. Electronically Signed   By: Jeronimo Greaves M.D.   On: 10/27/2015 13:59    MDM UPT positive Ultrasound shows IUP with cardiac activity. No subchorionic hemorrhage.   Assessment and Plan  A: 1. Vaginal bleeding in pregnancy, first trimester   2. Abdominal pain in pregnancy   3. IUP (intrauterine pregnancy), incidental     P: Discharge home Pelvic rest Pregnancy verification letter & list of providers given Discussed reasons to return  Judeth Horn, NP  10/27/2015, 12:41 PM

## 2015-10-27 NOTE — MAU Note (Signed)
Started bleeding last night, was a really dark color. Noted it also this morning, did not wear a pad in.  One small clots. Started feeling crampy this morning.

## 2015-10-28 LAB — HIV ANTIBODY (ROUTINE TESTING W REFLEX): HIV SCREEN 4TH GENERATION: NONREACTIVE

## 2015-10-28 LAB — GC/CHLAMYDIA PROBE AMP (~~LOC~~) NOT AT ARMC
Chlamydia: NEGATIVE
Neisseria Gonorrhea: NEGATIVE

## 2015-11-13 LAB — OB RESULTS CONSOLE HEPATITIS B SURFACE ANTIGEN: Hepatitis B Surface Ag: NEGATIVE

## 2016-01-18 ENCOUNTER — Inpatient Hospital Stay (HOSPITAL_COMMUNITY)
Admission: AD | Admit: 2016-01-18 | Discharge: 2016-01-18 | Disposition: A | Discharge status: Home / Self Care | Payer: Medicaid Other | Source: Ambulatory Visit | Attending: Obstetrics and Gynecology | Admitting: Obstetrics and Gynecology

## 2016-01-18 ENCOUNTER — Encounter (HOSPITAL_COMMUNITY): Payer: Self-pay | Admitting: *Deleted

## 2016-01-18 DIAGNOSIS — E860 Dehydration: Secondary | ICD-10-CM | POA: Insufficient documentation

## 2016-01-18 DIAGNOSIS — R030 Elevated blood-pressure reading, without diagnosis of hypertension: Secondary | ICD-10-CM | POA: Insufficient documentation

## 2016-01-18 DIAGNOSIS — E282 Polycystic ovarian syndrome: Secondary | ICD-10-CM | POA: Diagnosis not present

## 2016-01-18 DIAGNOSIS — N898 Other specified noninflammatory disorders of vagina: Secondary | ICD-10-CM | POA: Diagnosis not present

## 2016-01-18 DIAGNOSIS — Z3A18 18 weeks gestation of pregnancy: Secondary | ICD-10-CM | POA: Diagnosis not present

## 2016-01-18 DIAGNOSIS — N760 Acute vaginitis: Secondary | ICD-10-CM

## 2016-01-18 DIAGNOSIS — R109 Unspecified abdominal pain: Secondary | ICD-10-CM | POA: Diagnosis present

## 2016-01-18 DIAGNOSIS — O23592 Infection of other part of genital tract in pregnancy, second trimester: Secondary | ICD-10-CM | POA: Diagnosis not present

## 2016-01-18 DIAGNOSIS — O26892 Other specified pregnancy related conditions, second trimester: Secondary | ICD-10-CM | POA: Diagnosis not present

## 2016-01-18 DIAGNOSIS — B9689 Other specified bacterial agents as the cause of diseases classified elsewhere: Secondary | ICD-10-CM

## 2016-01-18 LAB — URINE MICROSCOPIC-ADD ON

## 2016-01-18 LAB — WET PREP, GENITAL
Sperm: NONE SEEN
TRICH WET PREP: NONE SEEN
YEAST WET PREP: NONE SEEN

## 2016-01-18 LAB — URINALYSIS, ROUTINE W REFLEX MICROSCOPIC
Bilirubin Urine: NEGATIVE
GLUCOSE, UA: NEGATIVE mg/dL
Hgb urine dipstick: NEGATIVE
Ketones, ur: 15 mg/dL — AB
NITRITE: NEGATIVE
PROTEIN: 30 mg/dL — AB
Specific Gravity, Urine: 1.02 (ref 1.005–1.030)
pH: 6.5 (ref 5.0–8.0)

## 2016-01-18 MED ORDER — METRONIDAZOLE 500 MG PO TABS: 500.0000 mg | ORAL_TABLET | Freq: Two times a day (BID) | ORAL | Status: AC

## 2016-01-18 MED ORDER — OXYCODONE-ACETAMINOPHEN 5-325 MG PO TABS
1.0000 | ORAL_TABLET | ORAL | Status: AC
  Administered 2016-01-18: 1 via ORAL
  Filled 2016-01-18: qty 1

## 2016-01-18 MED ORDER — OXYCODONE-ACETAMINOPHEN 5-325 MG PO TABS: 1.0000 | ORAL_TABLET | Freq: Four times a day (QID) | ORAL | Status: DC | PRN

## 2016-01-18 NOTE — MAU Note (Signed)
Pt presents to MAU with complaints of lower abdominal cramping that started this morning. She noticed some mucous discharge when she wiped this morning.

## 2016-01-18 NOTE — Progress Notes (Addendum)
Krista Solis is a 25 yo, G1P0, at 18.6 weeks  Presenting to MAU announced for lower abdominal cramping/pain and blood tinged mucous when she wipes.  States it sometimes hurts when she urinates and she noticed a dime size amount of mucous earlier today when she went to the bathroom. She was advised by the on call CNM to take ibuprofen, 600 mg, and wait -1hr, drink fluids and rest to determine if the lower abdominal pain/cramping would subside.   Pt called back 45 min later in tears stating the pain/cramping had gotten worse and the ibuprofen did not help.  Pt was advised to come to MAU for evaluation.       History     There are no active problems to display for this patient.   Chief Complaint  Patient presents with  . Vaginal Discharge  . Abdominal Pain   HPI  OB History    Gravida Para Term Preterm AB TAB SAB Ectopic Multiple Living   1               Past Medical History  Diagnosis Date  . PCOS (polycystic ovarian syndrome)     Past Surgical History  Procedure Laterality Date  . Laparoscopic gastric sleeve resection  2014  . Wisdom tooth extraction      History reviewed. No pertinent family history.  Social History  Substance Use Topics  . Smoking status: Never Smoker   . Smokeless tobacco: None  . Alcohol Use: Yes     Comment: occasional    Allergies:  Allergies  Allergen Reactions  . Eggs Or Egg-Derived Products Shortness Of Breath and Swelling  . Morphine And Related Nausea And Vomiting    Prescriptions prior to admission  Medication Sig Dispense Refill Last Dose  . acetaminophen (TYLENOL) 325 MG tablet Take 650 mg by mouth every 6 (six) hours as needed for mild pain.   01/17/2016 at Unknown time  . albuterol (PROVENTIL HFA;VENTOLIN HFA) 108 (90 Base) MCG/ACT inhaler Inhale 1 puff into the lungs every 6 (six) hours as needed for wheezing or shortness of breath.   rescue  . ibuprofen (ADVIL,MOTRIN) 200 MG tablet Take 200 mg by mouth every 6 (six) hours  as needed for headache or moderate pain.   01/18/2016 at Unknown time  . Prenatal Vit-Fe Fumarate-FA (PRENATAL MULTIVITAMIN) TABS tablet Take 1 tablet by mouth daily at 12 noon.   01/17/2016 at Unknown time  . triamcinolone cream (KENALOG) 0.1 % Apply 1 application topically 2 (two) times daily as needed (for eczema).   01/17/2016 at Unknown time  . ALPRAZolam (XANAX) 0.5 MG tablet Take 0.5 mg by mouth 2 (two) times daily as needed for anxiety.   Past Week at Unknown time    ROS Physical Exam   Blood pressure 155/94, pulse 99, temperature 98.3 F (36.8 C), resp. rate 18, last menstrual period 09/08/2015.  Filed Vitals:   01/18/16 1227 01/18/16 1446  BP: 155/94 114/62  Pulse: 99 80  Temp: 98.3 F (36.8 C)   Resp: 18 18    Results for orders placed or performed during the hospital encounter of 01/18/16 (from the past 24 hour(s))  Urinalysis, Routine w reflex microscopic (not at [facility])     Status: Abnormal   Collection Time: 01/18/16 12:30 PM  Result Value Ref Range   Color, Urine YELLOW YELLOW   APPearance HAZY (A) CLEAR   Specific Gravity, Urine 1.020 1.005 - 1.030   pH 6.5 5.0 - 8.0  Glucose, UA NEGATIVE NEGATIVE mg/dL   Hgb urine dipstick NEGATIVE NEGATIVE   Bilirubin Urine NEGATIVE NEGATIVE   Ketones, ur 15 (A) NEGATIVE mg/dL   Protein, ur 30 (A) NEGATIVE mg/dL   Nitrite NEGATIVE NEGATIVE   Leukocytes, UA TRACE (A) NEGATIVE  Urine microscopic-add on     Status: Abnormal   Collection Time: 01/18/16 12:30 PM  Result Value Ref Range   Squamous Epithelial / LPF 0-5 (A) NONE SEEN   WBC, UA 0-5 0 - 5 WBC/hpf   RBC / HPF 0-5 0 - 5 RBC/hpf   Bacteria, UA MANY (A) NONE SEEN   Urine-Other MUCOUS PRESENT   Wet prep, genital     Status: Abnormal   Collection Time: 01/18/16  1:14 PM  Result Value Ref Range   Yeast Wet Prep HPF POC NONE SEEN NONE SEEN   Trich, Wet Prep NONE SEEN NONE SEEN   Clue Cells Wet Prep HPF POC PRESENT (A) NONE SEEN   WBC, Wet Prep HPF POC FEW (A) NONE SEEN    Sperm NONE SEEN     FHT: 155 bpm, UC: No contractions SVE:  0/0/-4   Physical Exam  Constitutional: She is oriented to person, place, and time. She appears well-developed.  HENT:  Head: Normocephalic.  Neck: Normal range of motion.  Cardiovascular: Normal rate and regular rhythm.   Respiratory: Effort normal.  GI: There is tenderness.  Genitourinary: Uterus is tender. Cervix exhibits discharge. Cervix exhibits no motion tenderness and no friability. Right adnexum displays tenderness. Left adnexum displays tenderness. There is tenderness in the vagina. Vaginal discharge found.  SVE:  0/0/-4, vaginal discharge with odor noted.   Neurological: She is alert and oriented to person, place, and time. She has normal reflexes.  Skin: Skin is warm and dry.  Psychiatric: She has a normal mood and affect. Her behavior is normal. Judgment and thought content normal.    ED Course  Assessment: IUP 18.6 wks Elevated BP on arrival, repeat normotensive Adenexal & Uterine tenderness Vaginal discharge Cervical mucous noted SVE 0/0/-4   Interrum Plan: UA Wet prep Gh/CH cultures Percocet for pain, 1 tab PO  Final Assessment: UA negative for nitrites Dehydration, +15 ketones Bacterial Vaginosis GC//CH culture pending  Final Plan: DC Home RX Flagyl PO , pt preference over gel  RX Percocet, 10 tab, no refills Increase water intake  Cultures pending, will receive a call if results are positive, no call if negative Keep current return OB appt with [facility] Call PRN    Alphonzo Severanceachel Lonette Stevison CNM, MSN 01/18/2016 1:43 PM

## 2016-01-18 NOTE — Discharge Instructions (Signed)
Bacterial Vaginosis Bacterial vaginosis is a vaginal infection that occurs when the normal balance of bacteria in the vagina is disrupted. It results from an overgrowth of certain bacteria. This is the most common vaginal infection in women of childbearing age. Treatment is important to prevent complications, especially in pregnant women, as it can cause a premature delivery. CAUSES  Bacterial vaginosis is caused by an increase in harmful bacteria that are normally present in smaller amounts in the vagina. Several different kinds of bacteria can cause bacterial vaginosis. However, the reason that the condition develops is not fully understood. RISK FACTORS Certain activities or behaviors can put you at an increased risk of developing bacterial vaginosis, including:  Having a new sex partner or multiple sex partners.  Douching.  Using an intrauterine device (IUD) for contraception. Women do not get bacterial vaginosis from toilet seats, bedding, swimming pools, or contact with objects around them. SIGNS AND SYMPTOMS  Some women with bacterial vaginosis have no signs or symptoms. Common symptoms include:  Grey vaginal discharge.  A fishlike odor with discharge, especially after sexual intercourse.  Itching or burning of the vagina and vulva.  Burning or pain with urination. DIAGNOSIS  Your health care provider will take a medical history and examine the vagina for signs of bacterial vaginosis. A sample of vaginal fluid may be taken. Your health care provider will look at this sample under a microscope to check for bacteria and abnormal cells. A vaginal pH test may also be done.  TREATMENT  Bacterial vaginosis may be treated with antibiotic medicines. These may be given in the form of a pill or a vaginal cream. A second round of antibiotics may be prescribed if the condition comes back after treatment. Because bacterial vaginosis increases your risk for sexually transmitted diseases, getting  treated can help reduce your risk for chlamydia, gonorrhea, HIV, and herpes. HOME CARE INSTRUCTIONS   Only take over-the-counter or prescription medicines as directed by your health care provider.  If antibiotic medicine was prescribed, take it as directed. Make sure you finish it even if you start to feel better.  Tell all sexual partners that you have a vaginal infection. They should see their health care provider and be treated if they have problems, such as a mild rash or itching.  During treatment, it is important that you follow these instructions:  Avoid sexual activity or use condoms correctly.  Do not douche.  Avoid alcohol as directed by your health care provider.  Avoid breastfeeding as directed by your health care provider. SEEK MEDICAL CARE IF:   Your symptoms are not improving after 3 days of treatment.  You have increased discharge or pain.  You have a fever. MAKE SURE YOU:   Understand these instructions.  Will watch your condition.  Will get help right away if you are not doing well or get worse. FOR MORE INFORMATION  Centers for Disease Control and Prevention, Division of STD Prevention: SolutionApps.co.za American Sexual Health Association (ASHA): www.ashastd.org    This information is not intended to replace advice given to you by your health care provider. Make sure you discuss any questions you have with your health care provider.   Document Released: 09/13/2005 Document Revised: 10/04/2014 Document Reviewed: 04/25/2013 Elsevier Interactive Patient Education 2016 Elsevier Inc. Dehydration, Adult Dehydration is a condition in which you do not have enough fluid or water in your body. It happens when you take in less fluid than you lose. Vital organs such as the kidneys, brain,  and heart cannot function without a proper amount of fluids. Any loss of fluids from the body can cause dehydration.  Dehydration can range from mild to severe. This condition should  be treated right away to help prevent it from becoming severe. CAUSES  This condition may be caused by:  Vomiting.  Diarrhea.  Excessive sweating, such as when exercising in hot or humid weather.  Not drinking enough fluid during strenuous exercise or during an illness.  Excessive urine output.  Fever.  Certain medicines. RISK FACTORS This condition is more likely to develop in:  People who are taking certain medicines that cause the body to lose excess fluid (diuretics).   People who have a chronic illness, such as diabetes, that may increase urination.  Older adults.   People who live at high altitudes.   People who participate in endurance sports.  SYMPTOMS  Mild Dehydration  Thirst.  Dry lips.  Slightly dry mouth.  Dry, warm skin. Moderate Dehydration  Very dry mouth.   Muscle cramps.   Dark urine and decreased urine production.   Decreased tear production.   Headache.   Light-headedness, especially when you stand up from a sitting position.  Severe Dehydration  Changes in skin.   Cold and clammy skin.   Skin does not spring back quickly when lightly pinched and released.   Changes in body fluids.   Extreme thirst.   No tears.   Not able to sweat when body temperature is high, such as in hot weather.   Minimal urine production.   Changes in vital signs.   Rapid, weak pulse (more than 100 beats per minute when you are sitting still).   Rapid breathing.   Low blood pressure.   Other changes.   Sunken eyes.   Cold hands and feet.   Confusion.  Lethargy and difficulty being awakened.  Fainting (syncope).   Short-term weight loss.   Unconsciousness. DIAGNOSIS  This condition may be diagnosed based on your symptoms. You may also have tests to determine how severe your dehydration is. These tests may include:   Urine tests.   Blood tests.  TREATMENT  Treatment for this condition depends on  the severity. Mild or moderate dehydration can often be treated at home. Treatment should be started right away. Do not wait until dehydration becomes severe. Severe dehydration needs to be treated at the hospital. Treatment for Mild Dehydration  Drinking plenty of water to replace the fluid you have lost.   Replacing minerals in your blood (electrolytes) that you may have lost.  Treatment for Moderate Dehydration  Consuming oral rehydration solution (ORS). Treatment for Severe Dehydration  Receiving fluid through an IV tube.   Receiving electrolyte solution through a feeding tube that is passed through your nose and into your stomach (nasogastric tube or NG tube).  Correcting any abnormalities in electrolytes. HOME CARE INSTRUCTIONS   Drink enough fluid to keep your urine clear or pale yellow.   Drink water or fluid slowly by taking small sips. You can also try sucking on ice cubes.  Have food or beverages that contain electrolytes. Examples include bananas and sports drinks.  Take over-the-counter and prescription medicines only as told by your health care provider.   Prepare ORS according to the manufacturer's instructions. Take sips of ORS every 5 minutes until your urine returns to normal.  If you have vomiting or diarrhea, continue to try to drink water, ORS, or both.   If you have diarrhea, avoid:  Beverages that contain caffeine.   Fruit juice.   Milk.   Carbonated soft drinks.  Do not take salt tablets. This can lead to the condition of having too much sodium in your body (hypernatremia).  SEEK MEDICAL CARE IF:  You cannot eat or drink without vomiting.  You have had moderate diarrhea during a period of more than 24 hours.  You have a fever. SEEK IMMEDIATE MEDICAL CARE IF:   You have extreme thirst.  You have severe diarrhea.  You have not urinated in 6-8 hours, or you have urinated only a small amount of very dark urine.  You have  shriveled skin.  You are dizzy, confused, or both.   This information is not intended to replace advice given to you by your health care provider. Make sure you discuss any questions you have with your health care provider.   Document Released: 09/13/2005 Document Revised: 06/04/2015 Document Reviewed: 01/29/2015 Elsevier Interactive Patient Education [employer]2016 Elsevier Inc.

## 2016-01-19 LAB — GC/CHLAMYDIA PROBE AMP (~~LOC~~) NOT AT ARMC
Chlamydia: NEGATIVE
Neisseria Gonorrhea: NEGATIVE

## 2016-05-20 LAB — OB RESULTS CONSOLE GBS: GBS: NEGATIVE

## 2016-06-11 ENCOUNTER — Inpatient Hospital Stay (HOSPITAL_COMMUNITY): Payer: Medicaid Other | Admitting: Anesthesiology

## 2016-06-11 ENCOUNTER — Inpatient Hospital Stay (HOSPITAL_COMMUNITY)
Admission: AD | Admit: 2016-06-11 | Discharge: 2016-06-14 | DRG: 775 | Disposition: A | Discharge status: Home / Self Care | Payer: Medicaid Other | Source: Ambulatory Visit | Attending: Obstetrics and Gynecology | Admitting: Obstetrics and Gynecology

## 2016-06-11 ENCOUNTER — Encounter (HOSPITAL_COMMUNITY): Payer: Self-pay | Admitting: Certified Nurse Midwife

## 2016-06-11 DIAGNOSIS — Z6841 Body Mass Index (BMI) 40.0 and over, adult: Secondary | ICD-10-CM | POA: Diagnosis not present

## 2016-06-11 DIAGNOSIS — O99214 Obesity complicating childbirth: Secondary | ICD-10-CM | POA: Diagnosis present

## 2016-06-11 DIAGNOSIS — J45909 Unspecified asthma, uncomplicated: Secondary | ICD-10-CM | POA: Diagnosis present

## 2016-06-11 DIAGNOSIS — O1410 Severe pre-eclampsia, unspecified trimester: Secondary | ICD-10-CM | POA: Diagnosis not present

## 2016-06-11 DIAGNOSIS — O9952 Diseases of the respiratory system complicating childbirth: Secondary | ICD-10-CM | POA: Diagnosis present

## 2016-06-11 DIAGNOSIS — R030 Elevated blood-pressure reading, without diagnosis of hypertension: Secondary | ICD-10-CM | POA: Diagnosis present

## 2016-06-11 DIAGNOSIS — Z3A39 39 weeks gestation of pregnancy: Secondary | ICD-10-CM

## 2016-06-11 DIAGNOSIS — O1414 Severe pre-eclampsia complicating childbirth: Principal | ICD-10-CM | POA: Diagnosis present

## 2016-06-11 HISTORY — DX: Anxiety disorder, unspecified: F41.9

## 2016-06-11 HISTORY — DX: Unspecified asthma, uncomplicated: J45.909

## 2016-06-11 HISTORY — DX: Other specified health status: Z78.9

## 2016-06-11 LAB — URIC ACID: URIC ACID, SERUM: 5.2 mg/dL (ref 2.3–6.6)

## 2016-06-11 LAB — CBC WITH DIFFERENTIAL/PLATELET
BASOS ABS: 0 10*3/uL (ref 0.0–0.1)
Basophils Relative: 0 %
EOS ABS: 0.1 10*3/uL (ref 0.0–0.7)
EOS PCT: 1 %
HCT: 33.9 % — ABNORMAL LOW (ref 36.0–46.0)
HEMOGLOBIN: 11.6 g/dL — AB (ref 12.0–15.0)
LYMPHS PCT: 35 %
Lymphs Abs: 2.6 10*3/uL (ref 0.7–4.0)
MCH: 28.5 pg (ref 26.0–34.0)
MCHC: 34.2 g/dL (ref 30.0–36.0)
MCV: 83.3 fL (ref 78.0–100.0)
Monocytes Absolute: 0.7 10*3/uL (ref 0.1–1.0)
Monocytes Relative: 9 %
NEUTROS PCT: 55 %
Neutro Abs: 4.1 10*3/uL (ref 1.7–7.7)
PLATELETS: 229 10*3/uL (ref 150–400)
RBC: 4.07 MIL/uL (ref 3.87–5.11)
RDW: 13 % (ref 11.5–15.5)
WBC: 7.5 10*3/uL (ref 4.0–10.5)

## 2016-06-11 LAB — COMPREHENSIVE METABOLIC PANEL
ALBUMIN: 3 g/dL — AB (ref 3.5–5.0)
ALBUMIN: 2.9 g/dL — AB (ref 3.5–5.0)
ALK PHOS: 195 U/L — AB (ref 38–126)
ALT: 12 U/L — ABNORMAL LOW (ref 14–54)
ALT: 13 U/L — ABNORMAL LOW (ref 14–54)
ANION GAP: 8 (ref 5–15)
AST: 21 U/L (ref 15–41)
AST: 22 U/L (ref 15–41)
Alkaline Phosphatase: 186 U/L — ABNORMAL HIGH (ref 38–126)
Anion gap: 7 (ref 5–15)
BILIRUBIN TOTAL: 0.9 mg/dL (ref 0.3–1.2)
BUN: 11 mg/dL (ref 6–20)
BUN: 11 mg/dL (ref 6–20)
CALCIUM: 8.6 mg/dL — AB (ref 8.9–10.3)
CHLORIDE: 108 mmol/L (ref 101–111)
CO2: 20 mmol/L — AB (ref 22–32)
CO2: 22 mmol/L (ref 22–32)
Calcium: 8.5 mg/dL — ABNORMAL LOW (ref 8.9–10.3)
Chloride: 108 mmol/L (ref 101–111)
Creatinine, Ser: 0.73 mg/dL (ref 0.44–1.00)
Creatinine, Ser: 0.7 mg/dL (ref 0.44–1.00)
GFR calc Af Amer: >60 mL/min (ref >60)
GFR calc non Af Amer: >60 mL/min (ref >60)
GFR calc non Af Amer: >60 mL/min (ref >60)
GLUCOSE: 69 mg/dL (ref 65–99)
GLUCOSE: 74 mg/dL (ref 65–99)
POTASSIUM: 3.8 mmol/L (ref 3.5–5.1)
POTASSIUM: 3.8 mmol/L (ref 3.5–5.1)
SODIUM: 136 mmol/L (ref 135–145)
Sodium: 137 mmol/L (ref 135–145)
TOTAL PROTEIN: 6.8 g/dL (ref 6.5–8.1)
Total Bilirubin: 0.6 mg/dL (ref 0.3–1.2)
Total Protein: 6.7 g/dL (ref 6.5–8.1)

## 2016-06-11 LAB — CBC
HEMATOCRIT: 35.7 % — AB (ref 36.0–46.0)
Hemoglobin: 12 g/dL (ref 12.0–15.0)
MCH: 28.2 pg (ref 26.0–34.0)
MCHC: 33.6 g/dL (ref 30.0–36.0)
MCV: 84 fL (ref 78.0–100.0)
Platelets: 222 10*3/uL (ref 150–400)
RBC: 4.25 MIL/uL (ref 3.87–5.11)
RDW: 13.1 % (ref 11.5–15.5)
WBC: 8 10*3/uL (ref 4.0–10.5)

## 2016-06-11 LAB — TYPE AND SCREEN
ABO/RH(D): B POS
ANTIBODY SCREEN: NEGATIVE

## 2016-06-11 LAB — LACTATE DEHYDROGENASE: LDH: 176 U/L (ref 98–192)

## 2016-06-11 LAB — PROTEIN / CREATININE RATIO, URINE
Creatinine, Urine: 278 mg/dL
PROTEIN CREATININE RATIO: 0.6 mg/mg{creat} — AB (ref 0.00–0.15)
TOTAL PROTEIN, URINE: 167 mg/dL

## 2016-06-11 MED ORDER — HYDRALAZINE HCL 20 MG/ML IJ SOLN
10.0000 mg | Freq: Once | INTRAMUSCULAR | Status: AC | PRN
  Administered 2016-06-12: 04:00:00 via INTRAVENOUS

## 2016-06-11 MED ORDER — PHENYLEPHRINE 40 MCG/ML (10ML) SYRINGE FOR IV PUSH (FOR BLOOD PRESSURE SUPPORT): 80.0000 ug | PREFILLED_SYRINGE | INTRAVENOUS | Status: DC | PRN

## 2016-06-11 MED ORDER — PHENYLEPHRINE 40 MCG/ML (10ML) SYRINGE FOR IV PUSH (FOR BLOOD PRESSURE SUPPORT)
80.0000 ug | PREFILLED_SYRINGE | INTRAVENOUS | Status: DC | PRN
  Filled 2016-06-11: qty 10

## 2016-06-11 MED ORDER — LACTATED RINGERS IV SOLN: 500.0000 mL | Freq: Once | INTRAVENOUS | Status: DC

## 2016-06-11 MED ORDER — LACTATED RINGERS IV SOLN: 500.0000 mL | INTRAVENOUS | Status: DC | PRN

## 2016-06-11 MED ORDER — EPHEDRINE 5 MG/ML INJ: 10.0000 mg | INTRAVENOUS | Status: DC | PRN

## 2016-06-11 MED ORDER — DIPHENHYDRAMINE HCL 50 MG/ML IJ SOLN: 12.5000 mg | INTRAMUSCULAR | Status: DC | PRN

## 2016-06-11 MED ORDER — LACTATED RINGERS IV SOLN
INTRAVENOUS | Status: DC
  Administered 2016-06-11 – 2016-06-12 (×3): via INTRAVENOUS

## 2016-06-11 MED ORDER — OXYCODONE-ACETAMINOPHEN 5-325 MG PO TABS: 2.0000 | ORAL_TABLET | ORAL | Status: DC | PRN

## 2016-06-11 MED ORDER — OXYTOCIN BOLUS FROM INFUSION: 500.0000 mL | Freq: Once | INTRAVENOUS | Status: DC

## 2016-06-11 MED ORDER — LABETALOL HCL 5 MG/ML IV SOLN
20.0000 mg | INTRAVENOUS | Status: DC | PRN
  Administered 2016-06-11: 40 mg via INTRAVENOUS
  Administered 2016-06-11: 20 mg via INTRAVENOUS
  Filled 2016-06-11: qty 4
  Filled 2016-06-11: qty 8
  Filled 2016-06-11 (×2): qty 4
  Filled 2016-06-11: qty 8

## 2016-06-11 MED ORDER — LABETALOL HCL 5 MG/ML IV SOLN
20.0000 mg | INTRAVENOUS | Status: AC | PRN
  Administered 2016-06-12: 40 mg via INTRAVENOUS
  Administered 2016-06-12 – 2016-06-13 (×2): 20 mg via INTRAVENOUS

## 2016-06-11 MED ORDER — ONDANSETRON HCL 4 MG/2ML IJ SOLN: 4.0000 mg | Freq: Four times a day (QID) | INTRAMUSCULAR | Status: DC | PRN

## 2016-06-11 MED ORDER — MAGNESIUM SULFATE BOLUS VIA INFUSION
4.0000 g | Freq: Once | INTRAVENOUS | Status: AC
Start: 2016-06-11 — End: 2016-06-11
  Administered 2016-06-11: 4 g via INTRAVENOUS
  Filled 2016-06-11: qty 500

## 2016-06-11 MED ORDER — ACETAMINOPHEN 325 MG PO TABS: 650.0000 mg | ORAL_TABLET | ORAL | Status: DC | PRN

## 2016-06-11 MED ORDER — HYDRALAZINE HCL 20 MG/ML IJ SOLN
10.0000 mg | Freq: Once | INTRAMUSCULAR | Status: DC | PRN
  Filled 2016-06-11 (×2): qty 1

## 2016-06-11 MED ORDER — LIDOCAINE HCL (PF) 1 % IJ SOLN
INTRAMUSCULAR | Status: DC | PRN
  Administered 2016-06-11 (×2): 5 mL

## 2016-06-11 MED ORDER — SOD CITRATE-CITRIC ACID 500-334 MG/5ML PO SOLN
30.0000 mL | ORAL | Status: DC | PRN
  Administered 2016-06-12: 30 mL via ORAL
  Filled 2016-06-11: qty 15

## 2016-06-11 MED ORDER — OXYCODONE-ACETAMINOPHEN 5-325 MG PO TABS: 1.0000 | ORAL_TABLET | ORAL | Status: DC | PRN

## 2016-06-11 MED ORDER — LACTATED RINGERS IV SOLN
2.0000 g/h | INTRAVENOUS | Status: DC
  Administered 2016-06-11: 2 g/h via INTRAVENOUS
  Filled 2016-06-11: qty 80

## 2016-06-11 MED ORDER — FENTANYL 2.5 MCG/ML BUPIVACAINE 1/10 % EPIDURAL INFUSION (WH - ANES): 14.0000 mL/h | INTRAMUSCULAR | Status: DC | PRN

## 2016-06-11 MED ORDER — OXYTOCIN 40 UNITS IN LACTATED RINGERS INFUSION - SIMPLE MED: 2.5000 [IU]/h | INTRAVENOUS | Status: DC

## 2016-06-11 MED ORDER — LIDOCAINE HCL (PF) 1 % IJ SOLN: 30.0000 mL | INTRAMUSCULAR | Status: DC | PRN

## 2016-06-11 MED ORDER — FENTANYL 2.5 MCG/ML BUPIVACAINE 1/10 % EPIDURAL INFUSION (WH - ANES)
14.0000 mL/h | INTRAMUSCULAR | Status: DC | PRN
Start: 2016-06-11 — End: 2016-06-12
  Administered 2016-06-11 – 2016-06-12 (×3): 14 mL/h via EPIDURAL
  Filled 2016-06-11 (×2): qty 125

## 2016-06-11 MED ORDER — FLEET ENEMA 7-19 GM/118ML RE ENEM: 1.0000 | ENEMA | RECTAL | Status: DC | PRN

## 2016-06-11 NOTE — H&P (Signed)
Krista Solis is a 25 y.o. female presenting for labor.pt denies any headache, RUQ pain or blurred vision OB History    Gravida Para Term Preterm AB Living   1             SAB TAB Ectopic Multiple Live Births                 Past Medical History:  Diagnosis Date  . Anxiety   . Asthma   . Medical history non-contributory   . PCOS (polycystic ovarian syndrome)    Past Surgical History:  Procedure Laterality Date  . LAPAROSCOPIC GASTRIC SLEEVE RESECTION  2014  . WISDOM TOOTH EXTRACTION     Family History: family history is not on file. Social History:  reports that she has never smoked. She has never used smokeless tobacco. She reports that she does not drink alcohol or use drugs.     Maternal Diabetes: No Genetic Screening: Normal Maternal Ultrasounds/Referrals: Normal Fetal Ultrasounds or other Referrals:  None Maternal Substance Abuse:  No Significant Maternal Medications:  None Significant Maternal Lab Results:  None Other Comments:  None  ROS History Dilation: 3 Effacement (%): 90 Station: -3 Exam by:: Krista Solis RNC Blood pressure 177/94, pulse (!) 57, temperature 98.2 F (36.8 C), temperature source Oral, resp. rate 18, last menstrual period 09/08/2015. Exam Physical Exam Physical Examination: General appearance - alert, well appearing, and in no distress Mental status - alert, oriented to person, place, and time Chest - clear to auscultation, no wheezes, rales or rhonchi, symmetric air entry Heart - normal rate and regular rhythm Abdomen - soft, nontender, nondistended, no masses or organomegaly gravid Extremities - Homan's sign negative bilaterally Prenatal labs: ABO, Rh: --/--/B POS (01/30 1231) Antibody:   Rubella:   RPR:    HBsAg:    HIV: Non Reactive (01/30 1243)  GBS:     Assessment/Plan: Labor with elevated BPS Will give IV labetalol.  Pt is not symptomatic Epidural prn Anticipate SVD Check pcr and pih labs   Victoriano Campion A 06/11/2016, 4:13  PM

## 2016-06-11 NOTE — Progress Notes (Addendum)
Assuming care of Krista Solis, 24 yo G1P0 @ 39.4 wks admitted in early labor and for elevated BPs. Pt's mother at bedside.  Subjective: Feeling ctxs in back. Rates pain from ctxs 7-8/10. Desires epidural. Denies h/a, visual changes, epigastric pain or difficulty breathing.  Objective: BP 131/71   Pulse 68   Temp 98.6 F (37 C) (Oral)   Resp (P) 18   Ht 5\' 6"  (1.676 m)   Wt (!) 143.8 kg (317 lb)   LMP 09/08/2015 Comment: short  SpO2 100%   BMI 51.17 kg/m  No intake/output data recorded. Total I/O In: 1043.7 [P.O.:240; I.V.:802.1; Other:1.6] Out: 250 [Urine:250] ----- serum creatinine 0.73 and 0.70 respectively.  Today's Vitals   06/11/16 2241 06/11/16 2242 06/11/16 2246 06/11/16 2247  BP: 131/71   131/71  Pulse: 67 61 72 68  Resp: 18   (P) 18  Temp:      TempSrc:      SpO2: 100%  100%   Weight:      Height:      PainSc:       Today's Vitals   06/11/16 2251 06/11/16 2252 06/11/16 2255 06/11/16 2256  BP: 137/73   133/76  Pulse: 63 66 65 65  Resp: 18   18  Temp:      TempSrc:      SpO2: 100%  100% 100%  Weight:      Height:      PainSc:       Received IV Labetalol at 16:25 and 18:36 today.  Magnesium Sulfate bolus started at 20:20. Magnesium Sulfate 2 gm/hr started at 20:40.  Gen: NAD. Asks lots of questions. Lungs: CTAB. CV: RRR w/o M/R/G. Abdomen: gravid, obese, NTND. FHT: BL 150 w/ moderate variability, +accels, no decels --- Unable to adequately trace externally due to maternal habitus. UC: Unable to trace externally due to habitus - pt reports q 3-4 min. SVE: Unable to determine -- cephalic by exam, Leopold's and bedside u/s. EFW ? 7 /12 lbs - difficult to determine due to habitus.  Assessment:  IUP at 39.4 wks preE w/ severe features  GBS neg Latent labor Morbid obesity (BMI 51.17 kg/m2)  Plan: Long discussion w/ pt re: plan. She is amenable to Epidural, then AROM w/ full internals once comfortable w/ epidural. R/B of Pitocin reviewed. Pt open to  augmentation if needed. Consulted Dr. Su Hiltoberts -- will proceed w/ Magnesium Sulfate due to PCR of 0.6 and receiving 2 doses of IV Labetalol. Discussed w/ pt who concurs w/ plan. R/B explained.   Sherre ScarletWILLIAMS, Krista Solis CNM 06/11/16, 20:56 PM

## 2016-06-11 NOTE — MAU Note (Signed)
Pt states she has an increase in pain and frequency of ctxs and has bloody show. Pt states she has not felt her baby move since this AM.

## 2016-06-11 NOTE — Progress Notes (Addendum)
  Subjective: Comfortable w/ epidural. Pt's mother at bedside. Denies double or blurred vision, nausea, headache, flushing, warmth, somnolence, slurred speech, weakness, CP or SOB.   Objective: BP 133/76   Pulse 65   Temp 98.6 F (37 C) (Oral)   Resp 18   Ht 5\' 6"  (1.676 m)   Wt (!) 143.8 kg (317 lb)   LMP 09/08/2015 Comment: short  SpO2 100%   BMI 51.17 kg/m  No intake/output data recorded. Total I/O In: 1043.7 [P.O.:240; I.V.:802.1; Other:1.6] Out: 250 [Urine:250]    FHT: BL 120 w/ moderate variability, +accels, few mild variables shortly after midnight (resolved w/ position change), no lates UC:   irregular, every 1-4 minutes SVE:   Dilation: 4 Effacement (%): 90 Station: -2 Exam by:: k. Tyeler Goedken cnm@ 2330 AROM, scant amount of yellow fluid (possibly meconium) @ 2330 FSE placed at 2330 IUPC placed at 2334  Assessment:  IUP at 39.4 wks preE w/ severe features; on Magnesium Sulfate GBS neg  Plan: Await calculation of MVUs - if less than 200, will begin Pitocin augmentation. Dr. Su Hiltoberts updated.  Sherre Solis, Krista Boffa CNM 06/11/2016, 11:45 PM

## 2016-06-11 NOTE — Anesthesia Procedure Notes (Signed)
Epidural Patient location during procedure: OB Start time: 06/11/2016 10:02 PM End time: 06/11/2016 10:12 PM  Staffing Anesthesiologist: Bonita QuinGUIDETTI, Kelissa Merlin S Performed: anesthesiologist   Preanesthetic Checklist Completed: patient identified, site marked, surgical consent, pre-op evaluation, timeout performed, IV checked, risks and benefits discussed and monitors and equipment checked  Epidural Patient position: sitting Prep: site prepped and draped and DuraPrep Patient monitoring: continuous pulse ox and blood pressure Approach: midline Location: L3-L4 Injection technique: LOR air  Needle:  Needle type: Tuohy  Needle gauge: 17 G Needle length: 15 cm Needle insertion depth: 13 cm Catheter type: closed end flexible Catheter size: 19 Gauge Catheter at skin depth: 17 cm Test dose: negative  Assessment Events: blood not aspirated, injection not painful, no injection resistance, negative IV test and no paresthesia

## 2016-06-11 NOTE — Anesthesia Preprocedure Evaluation (Addendum)
Anesthesia Evaluation  Patient identified by MRN, date of birth, ID band Patient awake    Reviewed: Allergy & Precautions, NPO status , Patient's Chart, lab work & pertinent test results  Airway Mallampati: III  TM Distance: >3 FB Neck ROM: Full    Dental no notable dental hx.    Pulmonary neg pulmonary ROS, asthma ,    Pulmonary exam normal        Cardiovascular negative cardio ROS Normal cardiovascular exam     Neuro/Psych Anxiety negative neurological ROS  negative psych ROS   GI/Hepatic negative GI ROS, Neg liver ROS,   Endo/Other  Morbid obesity  Renal/GU negative Renal ROS  negative genitourinary   Musculoskeletal negative musculoskeletal ROS (+)   Abdominal   Peds negative pediatric ROS (+)  Hematology negative hematology ROS (+)   Anesthesia Other Findings Pre-eclampsia  Reproductive/Obstetrics negative OB ROS (+) Pregnancy                             Anesthesia Physical Anesthesia Plan  ASA: III  Anesthesia Plan: Epidural   Post-op Pain Management:    Induction:   Airway Management Planned:   Additional Equipment:   Intra-op Plan:   Post-operative Plan:   Informed Consent:   Plan Discussed with:   Anesthesia Plan Comments: (For C/S with labor epidural)       Anesthesia Quick Evaluation

## 2016-06-12 ENCOUNTER — Encounter (HOSPITAL_COMMUNITY)
Admission: AD | Disposition: A | Discharge status: Home / Self Care | Payer: Self-pay | Source: Ambulatory Visit | Attending: Obstetrics and Gynecology

## 2016-06-12 ENCOUNTER — Encounter (HOSPITAL_COMMUNITY): Payer: Self-pay | Admitting: Anesthesiology

## 2016-06-12 DIAGNOSIS — O1410 Severe pre-eclampsia, unspecified trimester: Secondary | ICD-10-CM | POA: Diagnosis not present

## 2016-06-12 DIAGNOSIS — J45909 Unspecified asthma, uncomplicated: Secondary | ICD-10-CM | POA: Diagnosis not present

## 2016-06-12 DIAGNOSIS — Z6841 Body Mass Index (BMI) 40.0 and over, adult: Secondary | ICD-10-CM

## 2016-06-12 LAB — CBC WITH DIFFERENTIAL/PLATELET
Basophils Absolute: 0 10*3/uL (ref 0.0–0.1)
Basophils Relative: 0 %
Eosinophils Absolute: 0.1 10*3/uL (ref 0.0–0.7)
Eosinophils Relative: 1 %
HCT: 33.2 % — ABNORMAL LOW (ref 36.0–46.0)
Hemoglobin: 11.4 g/dL — ABNORMAL LOW (ref 12.0–15.0)
Lymphocytes Relative: 15 %
Lymphs Abs: 1.6 10*3/uL (ref 0.7–4.0)
MCH: 28.2 pg (ref 26.0–34.0)
MCHC: 34.3 g/dL (ref 30.0–36.0)
MCV: 82.2 fL (ref 78.0–100.0)
Monocytes Absolute: 1.2 10*3/uL — ABNORMAL HIGH (ref 0.1–1.0)
Monocytes Relative: 12 %
Neutro Abs: 7.5 10*3/uL (ref 1.7–7.7)
Neutrophils Relative %: 72 %
Platelets: 139 10*3/uL — ABNORMAL LOW (ref 150–400)
RBC: 4.04 MIL/uL (ref 3.87–5.11)
RDW: 13 % (ref 11.5–15.5)
WBC: 10.4 10*3/uL (ref 4.0–10.5)

## 2016-06-12 LAB — CBC
HCT: 37.2 % (ref 36.0–46.0)
Hemoglobin: 13.1 g/dL (ref 12.0–15.0)
MCH: 29.1 pg (ref 26.0–34.0)
MCHC: 35.2 g/dL (ref 30.0–36.0)
MCV: 82.7 fL (ref 78.0–100.0)
Platelets: 189 K/uL (ref 150–400)
RBC: 4.5 MIL/uL (ref 3.87–5.11)
RDW: 13.1 % (ref 11.5–15.5)
WBC: 16.1 K/uL — ABNORMAL HIGH (ref 4.0–10.5)

## 2016-06-12 LAB — COMPREHENSIVE METABOLIC PANEL
ALT: 12 U/L — ABNORMAL LOW (ref 14–54)
ANION GAP: 9 (ref 5–15)
AST: 24 U/L (ref 15–41)
Albumin: 3 g/dL — ABNORMAL LOW (ref 3.5–5.0)
Alkaline Phosphatase: 190 U/L — ABNORMAL HIGH (ref 38–126)
BUN: 10 mg/dL (ref 6–20)
CHLORIDE: 106 mmol/L (ref 101–111)
CO2: 19 mmol/L — ABNORMAL LOW (ref 22–32)
Calcium: 8.1 mg/dL — ABNORMAL LOW (ref 8.9–10.3)
Creatinine, Ser: 0.77 mg/dL (ref 0.44–1.00)
Glucose, Bld: 80 mg/dL (ref 65–99)
POTASSIUM: 3.4 mmol/L — AB (ref 3.5–5.1)
Sodium: 134 mmol/L — ABNORMAL LOW (ref 135–145)
TOTAL PROTEIN: 6.7 g/dL (ref 6.5–8.1)
Total Bilirubin: 0.7 mg/dL (ref 0.3–1.2)

## 2016-06-12 LAB — RPR: RPR: NONREACTIVE

## 2016-06-12 LAB — MAGNESIUM: MAGNESIUM: 4.3 mg/dL — AB (ref 1.7–2.4)

## 2016-06-12 LAB — URIC ACID: Uric Acid, Serum: 5.3 mg/dL (ref 2.3–6.6)

## 2016-06-12 LAB — LACTATE DEHYDROGENASE: LDH: 189 U/L (ref 98–192)

## 2016-06-12 SURGERY — Surgical Case
Anesthesia: Epidural | Wound class: Clean Contaminated

## 2016-06-12 MED ORDER — LACTATED RINGERS IV SOLN
2.0000 g/h | INTRAVENOUS | Status: DC
  Filled 2016-06-12: qty 80

## 2016-06-12 MED ORDER — WITCH HAZEL-GLYCERIN EX PADS: 1.0000 "application " | MEDICATED_PAD | CUTANEOUS | Status: DC | PRN

## 2016-06-12 MED ORDER — DEXTROSE 5 % IV SOLN
INTRAVENOUS | Status: DC | PRN
  Administered 2016-06-12: 3 g via INTRAVENOUS

## 2016-06-12 MED ORDER — SENNOSIDES-DOCUSATE SODIUM 8.6-50 MG PO TABS
2.0000 | ORAL_TABLET | ORAL | Status: DC
  Administered 2016-06-12 – 2016-06-14 (×2): 2 via ORAL
  Filled 2016-06-12 (×2): qty 2

## 2016-06-12 MED ORDER — PRENATAL MULTIVITAMIN CH
1.0000 | ORAL_TABLET | Freq: Every day | ORAL | Status: DC
  Administered 2016-06-13 – 2016-06-14 (×2): 1 via ORAL
  Filled 2016-06-12 (×2): qty 1

## 2016-06-12 MED ORDER — OXYCODONE HCL 5 MG PO TABS: 5.0000 mg | ORAL_TABLET | ORAL | Status: DC | PRN

## 2016-06-12 MED ORDER — TERBUTALINE SULFATE 1 MG/ML IJ SOLN: 0.2500 mg | Freq: Once | INTRAMUSCULAR | Status: DC | PRN

## 2016-06-12 MED ORDER — COCONUT OIL OIL
1.0000 "application " | TOPICAL_OIL | Status: DC | PRN
  Administered 2016-06-12: 1 via TOPICAL
  Filled 2016-06-12 (×2): qty 120

## 2016-06-12 MED ORDER — SCOPOLAMINE 1 MG/3DAYS TD PT72
MEDICATED_PATCH | TRANSDERMAL | Status: DC | PRN
  Administered 2016-06-12: 1 via TRANSDERMAL

## 2016-06-12 MED ORDER — DEXAMETHASONE SODIUM PHOSPHATE 10 MG/ML IJ SOLN
INTRAMUSCULAR | Status: DC | PRN
  Administered 2016-06-12: 10 mg via INTRAVENOUS

## 2016-06-12 MED ORDER — SIMETHICONE 80 MG PO CHEW: 80.0000 mg | CHEWABLE_TABLET | ORAL | Status: DC | PRN

## 2016-06-12 MED ORDER — IBUPROFEN 600 MG PO TABS
600.0000 mg | ORAL_TABLET | Freq: Four times a day (QID) | ORAL | Status: DC
  Administered 2016-06-12 – 2016-06-14 (×9): 600 mg via ORAL
  Filled 2016-06-12 (×9): qty 1

## 2016-06-12 MED ORDER — ALBUTEROL SULFATE HFA 108 (90 BASE) MCG/ACT IN AERS: 1.0000 | INHALATION_SPRAY | Freq: Four times a day (QID) | RESPIRATORY_TRACT | Status: DC | PRN

## 2016-06-12 MED ORDER — BENZOCAINE-MENTHOL 20-0.5 % EX AERO
1.0000 "application " | INHALATION_SPRAY | CUTANEOUS | Status: DC | PRN
  Administered 2016-06-12: 1 via TOPICAL
  Filled 2016-06-12 (×2): qty 56

## 2016-06-12 MED ORDER — ALBUTEROL SULFATE (2.5 MG/3ML) 0.083% IN NEBU: 2.5000 mg | INHALATION_SOLUTION | Freq: Four times a day (QID) | RESPIRATORY_TRACT | Status: DC | PRN

## 2016-06-12 MED ORDER — MAGNESIUM SULFATE 40 G IN LACTATED RINGERS - SIMPLE
INTRAVENOUS | Status: DC | PRN
  Administered 2016-06-12: 2 g/h via INTRAVENOUS

## 2016-06-12 MED ORDER — ACETAMINOPHEN 325 MG PO TABS: 650.0000 mg | ORAL_TABLET | ORAL | Status: DC | PRN

## 2016-06-12 MED ORDER — ZOLPIDEM TARTRATE 5 MG PO TABS: 5.0000 mg | ORAL_TABLET | Freq: Every evening | ORAL | Status: DC | PRN

## 2016-06-12 MED ORDER — ONDANSETRON HCL 4 MG PO TABS: 4.0000 mg | ORAL_TABLET | ORAL | Status: DC | PRN

## 2016-06-12 MED ORDER — DIBUCAINE 1 % RE OINT
1.0000 "application " | TOPICAL_OINTMENT | RECTAL | Status: DC | PRN
  Filled 2016-06-12: qty 56.7

## 2016-06-12 MED ORDER — DEXAMETHASONE SODIUM PHOSPHATE 10 MG/ML IJ SOLN
INTRAMUSCULAR | Status: AC
  Filled 2016-06-12: qty 1

## 2016-06-12 MED ORDER — ONDANSETRON HCL 4 MG/2ML IJ SOLN
INTRAMUSCULAR | Status: DC | PRN
  Administered 2016-06-12: 4 mg via INTRAVENOUS

## 2016-06-12 MED ORDER — ONDANSETRON HCL 4 MG/2ML IJ SOLN: 4.0000 mg | INTRAMUSCULAR | Status: DC | PRN

## 2016-06-12 MED ORDER — DEXTROSE 5 % IV SOLN
INTRAVENOUS | Status: AC
  Filled 2016-06-12: qty 3000

## 2016-06-12 MED ORDER — OXYTOCIN 10 UNIT/ML IJ SOLN
INTRAMUSCULAR | Status: AC
  Filled 2016-06-12: qty 4

## 2016-06-12 MED ORDER — LACTATED RINGERS IV SOLN
INTRAVENOUS | Status: DC
  Administered 2016-06-12 (×2): via INTRAUTERINE

## 2016-06-12 MED ORDER — OXYTOCIN 40 UNITS IN LACTATED RINGERS INFUSION - SIMPLE MED
1.0000 m[IU]/min | INTRAVENOUS | Status: DC
  Administered 2016-06-12: 1 m[IU]/min via INTRAVENOUS
  Filled 2016-06-12: qty 1000

## 2016-06-12 MED ORDER — ONDANSETRON HCL 4 MG/2ML IJ SOLN
INTRAMUSCULAR | Status: AC
  Filled 2016-06-12: qty 2

## 2016-06-12 MED ORDER — MORPHINE SULFATE (PF) 0.5 MG/ML IJ SOLN
INTRAMUSCULAR | Status: AC
  Filled 2016-06-12: qty 10

## 2016-06-12 MED ORDER — OXYCODONE HCL 5 MG PO TABS: 10.0000 mg | ORAL_TABLET | ORAL | Status: DC | PRN

## 2016-06-12 MED ORDER — LACTATED RINGERS IV SOLN
INTRAVENOUS | Status: DC
  Administered 2016-06-13: 02:00:00 via INTRAVENOUS

## 2016-06-12 MED ORDER — TETANUS-DIPHTH-ACELL PERTUSSIS 5-2.5-18.5 LF-MCG/0.5 IM SUSP
0.5000 mL | Freq: Once | INTRAMUSCULAR | Status: DC
  Filled 2016-06-12: qty 0.5

## 2016-06-12 MED ORDER — SODIUM BICARBONATE 8.4 % IV SOLN
INTRAVENOUS | Status: DC | PRN
  Administered 2016-06-12: 4 mL via EPIDURAL
  Administered 2016-06-12: 5 mL via EPIDURAL
  Administered 2016-06-12 (×2): 3 mL via EPIDURAL

## 2016-06-12 MED ORDER — DIPHENHYDRAMINE HCL 25 MG PO CAPS: 25.0000 mg | ORAL_CAPSULE | Freq: Four times a day (QID) | ORAL | Status: DC | PRN

## 2016-06-12 MED ORDER — OXYTOCIN 10 UNIT/ML IJ SOLN
INTRAVENOUS | Status: DC | PRN
  Administered 2016-06-12: 40 [IU] via INTRAVENOUS

## 2016-06-12 MED ORDER — SCOPOLAMINE 1 MG/3DAYS TD PT72
MEDICATED_PATCH | TRANSDERMAL | Status: AC
  Filled 2016-06-12: qty 1

## 2016-06-12 SURGICAL SUPPLY — 40 items
BENZOIN TINCTURE PRP APPL 2/3 (GAUZE/BANDAGES/DRESSINGS) ×4 IMPLANT
CHLORAPREP W/TINT 26ML (MISCELLANEOUS) ×4 IMPLANT
CLAMP CORD UMBIL (MISCELLANEOUS) IMPLANT
CLOSURE WOUND 1/2 X4 (GAUZE/BANDAGES/DRESSINGS) ×1
CLOTH BEACON ORANGE TIMEOUT ST (SAFETY) ×4 IMPLANT
CONTAINER PREFILL 10% NBF 15ML (MISCELLANEOUS) IMPLANT
DRAIN JACKSON PRT FLT 10 (DRAIN) IMPLANT
DRSG OPSITE POSTOP 4X10 (GAUZE/BANDAGES/DRESSINGS) ×4 IMPLANT
ELECT REM PT RETURN 9FT ADLT (ELECTROSURGICAL) ×4
ELECTRODE REM PT RTRN 9FT ADLT (ELECTROSURGICAL) ×2 IMPLANT
EVACUATOR SILICONE 100CC (DRAIN) IMPLANT
EXTENDER TRAXI PANNICULUS (MISCELLANEOUS) ×2 IMPLANT
EXTRACTOR VACUUM M CUP 4 TUBE (SUCTIONS) IMPLANT
EXTRACTOR VACUUM M CUP 4' TUBE (SUCTIONS)
GLOVE BIO SURGEON STRL SZ 6.5 (GLOVE) ×3 IMPLANT
GLOVE BIO SURGEONS STRL SZ 6.5 (GLOVE) ×1
GLOVE BIOGEL PI IND STRL 7.0 (GLOVE) ×4 IMPLANT
GLOVE BIOGEL PI INDICATOR 7.0 (GLOVE) ×4
GOWN STRL REUS W/TWL LRG LVL3 (GOWN DISPOSABLE) ×8 IMPLANT
KIT ABG SYR 3ML LUER SLIP (SYRINGE) IMPLANT
NEEDLE HYPO 25X5/8 SAFETYGLIDE (NEEDLE) IMPLANT
NS IRRIG 1000ML POUR BTL (IV SOLUTION) ×4 IMPLANT
PACK C SECTION WH (CUSTOM PROCEDURE TRAY) ×4 IMPLANT
PAD OB MATERNITY 4.3X12.25 (PERSONAL CARE ITEMS) ×4 IMPLANT
PENCIL SMOKE EVAC W/HOLSTER (ELECTROSURGICAL) ×4 IMPLANT
RETRACTOR TRAXI PANNICULUS (MISCELLANEOUS) ×2 IMPLANT
RTRCTR C-SECT PINK 25CM LRG (MISCELLANEOUS) IMPLANT
STRIP CLOSURE SKIN 1/2X4 (GAUZE/BANDAGES/DRESSINGS) ×3 IMPLANT
SUT CHROMIC 0 CT 1 (SUTURE) ×4 IMPLANT
SUT MNCRL AB 3-0 PS2 27 (SUTURE) ×4 IMPLANT
SUT PLAIN 2 0 (SUTURE) ×4
SUT PLAIN 2 0 XLH (SUTURE) ×4 IMPLANT
SUT PLAIN ABS 2-0 CT1 27XMFL (SUTURE) ×4 IMPLANT
SUT SILK 2 0 SH (SUTURE) IMPLANT
SUT VIC AB 0 CTX 36 (SUTURE) ×8
SUT VIC AB 0 CTX36XBRD ANBCTRL (SUTURE) ×8 IMPLANT
TOWEL OR 17X24 6PK STRL BLUE (TOWEL DISPOSABLE) ×4 IMPLANT
TRAXI PANNICULUS EXTENDER (MISCELLANEOUS) ×2
TRAXI PANNICULUS RETRACTOR (MISCELLANEOUS) ×2
TRAY FOLEY CATH SILVER 14FR (SET/KITS/TRAYS/PACK) ×4 IMPLANT

## 2016-06-12 NOTE — Lactation Note (Addendum)
This note was copied from a baby's chart. Lactation Consultation Note  Patient Name: Krista Solis [id number] Date: 06/12/2016 Reason for consult: Initial assessment Assisted Mom with latching baby demonstrating breast compression. Basic teaching reviewed with Mom. Advised to BF with feeding ques, 8-12 times ore more in 24 hours. Lactation brochure left for review, advised of OP services and support group. Mom has history of PCOS but does report good breast changes early pregnancy. Mom reports pregnancy was "surprise", she lost a lot of weight and feels this made a difference. Encouraged to call for questions/concerns or assist as needed.  Mom requested DEBP kit for her pump at home.  Maternal Data Has patient been taught Hand Expression?: Yes Does the patient have breastfeeding experience prior to this delivery?: No  Feeding Feeding Type: Breast Fed Length of feed: 10 min  LATCH Score/Interventions Latch: Grasps breast easily, tongue down, lips flanged, rhythmical sucking.  Audible Swallowing: A few with stimulation  Type of Nipple: Everted at rest and after stimulation  Comfort (Breast/Nipple): Soft / non-tender     Hold (Positioning): Assistance needed to correctly position infant at breast and maintain latch. Intervention(s): Breastfeeding basics reviewed;Support Pillows;Position options;Skin to skin  LATCH Score: 8  Lactation Tools Discussed/Used WIC Program: Yes   Consult Status Consult Status: Follow-up Date: 06/13/16 Follow-up type: In-patient    Katrine Coho 06/12/2016, 6:06 PM

## 2016-06-12 NOTE — Progress Notes (Addendum)
Called to room due to FHR in the 90s after initiation of low dose Pitocin. Upon entrance to room, lab attempting to collect blood.  Subjective: Comfortable w/ epidural for the most part. States pain mostly in her back. Has had several PCA doses.  Objective: BP (!) 166/86   Pulse 76   Temp 98.4 F (36.9 C) (Oral)   Resp 18   Ht 5\' 6"  (1.676 m)   Wt (!) 143.8 kg (317 lb)   LMP 09/08/2015 Comment: short  SpO2 100%   BMI 51.17 kg/m  No intake/output data recorded. Total I/O In: 2230 [P.O.:720; I.V.:1438.3; Other:71.6] Out: 700 [Urine:700]  Adequate uop, however blood tinge urine noted. Bulb repositioned and foley line flushed.  Today's Vitals   06/12/16 0401 06/12/16 0421 06/12/16 0434 06/12/16 0506  BP: (!) 169/111 (!) 166/86 (!) 152/84 118/81  Pulse: 74 76 85 82  Resp: 18 18    Temp:      TempSrc:      SpO2:      Weight:      Height:      PainSc: 0-No pain      Hydralazine 10 mg given at 02:58 w/ good result. Had consulted Dr. Su Hiltoberts due to two pressures >160/110 s/p Hydralazine. Plan was to go back to Labetalol 20 mg, but pressures came down. Pt asymptomatic.  FHT: BL 115-120 w/ minimal variability, +accels, +scalp stim UC:   irregular, every 2-3 minutes SVE:   Dilation: 5 Effacement (%): 70, 80 Station: -2 Exam by:: kim Taiesha Bovard cnm  Cervical edema  Assessment:  Low FHR baseline Suspect OP position  Plan: Will maintain 02 Begin amnioinfusion Monitor closely Await results of preE labs + Mag level Consult as indicated  Sherre ScarletWILLIAMS, Timber Lucarelli CNM 06/12/2016, 5:10 AM

## 2016-06-12 NOTE — Progress Notes (Signed)
  Subjective: Feeling ongoing pressure in her rectum.  Objective: BP (!) 159/97   Pulse 77   Temp 98.4 F (36.9 C) (Oral)   Resp 18   Ht 5\' 6"  (1.676 m)   Wt (!) 143.8 kg (317 lb)   LMP 09/08/2015 Comment: short  SpO2 100%   BMI 51.17 kg/m  No intake/output data recorded. Total I/O In: 2230 [P.O.:720; I.V.:1438.3; Other:71.6] Out: 600 [Urine:600]   Today's Vitals   06/12/16 0102 06/12/16 0135 06/12/16 0202 06/12/16 0303  BP: 124/76 (!) 143/68 122/89 (!) 159/97  Pulse: 69 70 72 77  Resp: 18 18 18 18   Temp:      TempSrc:      SpO2:      Weight:      Height:      PainSc: 0-No pain  0-No pain 0-No pain    FHT: BL 90-100, back up to 110-120 w/ 02 application UC:   irregular, every 1-2 minutes SVE:   Dilation: 6.5 Effacement (%): 90 Station: -2 Exam by:: kristy rn@ 0339   Assessment:  Low baseline but overall reassuring  Active labor; progressing w/o Pitocin GBS neg ? OP position  Plan: Continue Peanut for positioning Monitor closely Consult as indicated  Sherre ScarletWILLIAMS, Krista Solis CNM 06/12/2016, 3:43 AM

## 2016-06-12 NOTE — Progress Notes (Signed)
  Subjective: Still having back pain despite 2 PCA doses.  Mother at bedside.  Patient "ready for C/S".  Objective: BP (!) 161/57   Pulse 91   Temp 98.4 F (36.9 C) (Axillary)   Resp 18   Ht 5\' 6"  (1.676 m)   Wt (!) 143.8 kg (317 lb)   LMP 09/08/2015 Comment: short  SpO2 100%   BMI 51.17 kg/m  I/O last 3 completed shifts: In: 2748.1 [P.O.:960; I.V.:1688.5; Other:99.6] Out: 900 [Urine:900] No intake/output data recorded.   Vitals:   06/12/16 0702 06/12/16 0707 06/12/16 0732 06/12/16 0800  BP: (!) 180/114 (!) 152/79 (!) 161/57   Pulse: 85 96 91   Resp:  18 18 18   Temp:      TempSrc:      SpO2:      Weight:      Height:       On Mag 2 gm/hr.  FHT: Category 2 at intervals, with mild variables and moderate variability.  Had decel  UC:   regular, every 3 minutes--IUPC extruded by vtx. SVE:   Dilation: 5 Effacement (%):  (swollen) Station: -1 Exam by:: kim williams, cnm at 07  Urine bloody--has been that way during labor. Output 900 cc last shift.  Pit had been stopped due to decel, patient declined restarting.   Assessment:  IUP at 39 5/7 weeks Arrest of active phase Pre-eclampsia with severe features (BP range) GBS negative BMI 51 Asthma Hematuria  Plan: Reviewed status with Dr. Normand Sloopillard. Will proceed with C/S. Risks and benefits of cesarean were reviewed with patient and family, including anesthesia, bleeding, infection, and damage to other organs.  Patient and family seem to understand these risks and are in agreement with proceeding with cesarean. Will continue magnesium for 24 hours pp. Dr. Normand Sloopillard aware of hematuria  Krista Solis, Krista Solis CNM 06/12/2016, 8:02 AM

## 2016-06-12 NOTE — Anesthesia Postprocedure Evaluation (Signed)
Anesthesia Post Note  Patient: Krista LinkJalea Solis  Procedure(s) Performed: Procedure(s) (LRB): CESAREAN SECTION (N/A)  Patient location during evaluation: Mother Baby Anesthesia Type: Epidural Level of consciousness: awake and alert Pain management: satisfactory to patient Vital Signs Assessment: post-procedure vital signs reviewed and stable Respiratory status: respiratory function stable Cardiovascular status: stable Postop Assessment: no headache, no backache, epidural receding, patient able to bend at knees, no signs of nausea or vomiting and adequate PO intake Anesthetic complications: no     Last Vitals:  Vitals:   06/12/16 1619 06/12/16 1628  BP: (!) 160/73 (!) 156/80  Pulse: 79 84  Resp: 17   Temp: 36.5 C     Last Pain:  Vitals:   06/12/16 1619  TempSrc: Oral  PainSc:    Pain Goal:                 Krista Solis

## 2016-06-12 NOTE — Addendum Note (Signed)
Addendum  created 06/12/16 2058 by Graciela HusbandsWynn O Dari Carpenito, CRNA   Sign clinical note

## 2016-06-12 NOTE — Transfer of Care (Signed)
Immediate Anesthesia Transfer of Care Note  Patient: Krista Solis  Procedure(s) Performed: Procedure(s): CESAREAN SECTION (N/A)  Patient Location: L&D  Anesthesia Type:Epidural  Level of Consciousness: awake, alert  and oriented  Airway & Oxygen Therapy: Patient Spontanous Breathing  Post-op Assessment: Report given to RN and Post -op Vital signs reviewed and stable  Post vital signs: Reviewed and stable  Last Vitals:  Vitals:   06/12/16 0902 06/12/16 0932  BP: (!) 146/89 (!) 150/95  Pulse: 90 88  Resp: 18 18  Temp:      Last Pain:  Vitals:   06/12/16 0902  TempSrc:   PainSc: Asleep         Complications: No apparent anesthesia complications

## 2016-06-12 NOTE — Addendum Note (Signed)
Addendum  created 06/12/16 1124 by Leilani AbleFranklin Melvern Ramone, MD   Sign clinical note

## 2016-06-12 NOTE — Progress Notes (Signed)
Pt brought  to OR suite for C-section. Pt placed on OR table. Epidural redosed per anesthesia.  Dr Normand Sloopillard performed vaginal exam on pt prior to starting procedure. Cervical exam showed pt was complete and +3. Decision was made to vaginally deliver pt in OR suite.

## 2016-06-12 NOTE — Anesthesia Postprocedure Evaluation (Addendum)
Anesthesia Post Note  Patient: Krista Solis  Procedure(s) Performed: Procedure(s) (LRB): CESAREAN SECTION (N/A)  Patient location during evaluation: L&D Anesthesia Type: Epidural Level of consciousness: sedated Pain management: pain level controlled Vital Signs Assessment: post-procedure vital signs reviewed and stable Respiratory status: respiratory function stable Cardiovascular status: stable Postop Assessment: no headache, no backache and epidural receding Anesthetic complications: no     Last Vitals:  Vitals:   06/12/16 0902 06/12/16 0932  BP: (!) 146/89 (!) 150/95  Pulse: 90 88  Resp: 18 18  Temp:      Last Pain:  Vitals:   06/12/16 0902  TempSrc:   PainSc: Asleep   Pain Goal:                 Kaziyah Parkison JR,JOHN [place]

## 2016-06-13 LAB — URIC ACID: URIC ACID, SERUM: 6 mg/dL (ref 2.3–6.6)

## 2016-06-13 LAB — LACTATE DEHYDROGENASE: LDH: 239 U/L — AB (ref 98–192)

## 2016-06-13 LAB — CBC
HCT: 27.3 % — ABNORMAL LOW (ref 36.0–46.0)
HEMOGLOBIN: 9.5 g/dL — AB (ref 12.0–15.0)
MCH: 28.9 pg (ref 26.0–34.0)
MCHC: 34.8 g/dL (ref 30.0–36.0)
MCV: 83 fL (ref 78.0–100.0)
Platelets: 208 10*3/uL (ref 150–400)
RBC: 3.29 MIL/uL — AB (ref 3.87–5.11)
RDW: 13.3 % (ref 11.5–15.5)
WBC: 18.5 10*3/uL — AB (ref 4.0–10.5)

## 2016-06-13 LAB — COMPREHENSIVE METABOLIC PANEL
ALBUMIN: 2.6 g/dL — AB (ref 3.5–5.0)
ALK PHOS: 140 U/L — AB (ref 38–126)
ALT: 11 U/L — AB (ref 14–54)
AST: 24 U/L (ref 15–41)
Anion gap: 6 (ref 5–15)
BUN: 10 mg/dL (ref 6–20)
CALCIUM: 7.5 mg/dL — AB (ref 8.9–10.3)
CO2: 22 mmol/L (ref 22–32)
CREATININE: 0.91 mg/dL (ref 0.44–1.00)
Chloride: 109 mmol/L (ref 101–111)
GFR calc Af Amer: >60 mL/min (ref >60)
GFR calc non Af Amer: >60 mL/min (ref >60)
GLUCOSE: 89 mg/dL (ref 65–99)
Potassium: 3.8 mmol/L (ref 3.5–5.1)
SODIUM: 137 mmol/L (ref 135–145)
Total Bilirubin: 0.5 mg/dL (ref 0.3–1.2)
Total Protein: 5.8 g/dL — ABNORMAL LOW (ref 6.5–8.1)

## 2016-06-13 LAB — MAGNESIUM: Magnesium: 5.2 mg/dL — ABNORMAL HIGH (ref 1.7–2.4)

## 2016-06-13 MED ORDER — FAMOTIDINE IN NACL 20-0.9 MG/50ML-% IV SOLN
20.0000 mg | Freq: Two times a day (BID) | INTRAVENOUS | Status: DC
  Filled 2016-06-13: qty 50

## 2016-06-13 MED ORDER — FAMOTIDINE 20 MG PO TABS
20.0000 mg | ORAL_TABLET | Freq: Every day | ORAL | Status: DC
  Administered 2016-06-13 – 2016-06-14 (×2): 20 mg via ORAL
  Filled 2016-06-13 (×2): qty 1

## 2016-06-13 MED ORDER — NIFEDIPINE ER OSMOTIC RELEASE 30 MG PO TB24
30.0000 mg | ORAL_TABLET | Freq: Every day | ORAL | Status: DC
  Administered 2016-06-13 – 2016-06-14 (×2): 30 mg via ORAL
  Filled 2016-06-13 (×2): qty 1

## 2016-06-13 NOTE — Lactation Note (Signed)
This note was copied from a baby's chart. Lactation Consultation Note  Patient Name: Krista Solis ZOXWR'UToday's Date: 06/13/2016 Reason for consult: Follow-up assessment  Mom reports sore nipples & is unsure if infant is getting anything at breast. I assisted w/latch & verified swallows with cervical auscultation (I let Mom listen, also). Mom feels that the latches during the consult were comfortable, overall (although she may still be feeling some residual discomfort from prior latches that were not done as well).   Specifics of an asymmetric latch shown via [employer] website animation.   Mom has my # to call for assist w/next feeding, if she'd like me to return.   Lurline HareRichey, Judas Mohammad [facility] 06/13/2016, 7:57 PM

## 2016-06-13 NOTE — Progress Notes (Addendum)
Post Partum Day 1 Subjective: no complaints, up ad lib, voiding, tolerating PO and denies lightheadedness, dizziness, HA, visual changes or abdominal pain. c/o GERD Objective: Blood pressure 138/75, pulse (!) 59, temperature 98 F (36.7 C), temperature source Oral, resp. rate 18, height 5\' 6"  (1.676 m), weight (!) 317 lb (143.8 kg), last menstrual period 09/08/2015, SpO2 100 %, unknown if currently breastfeeding.  Physical Exam:  General: alert and no distress Lochia: appropriate Uterine Fundus: firm Incision: n/a DVT Evaluation: No evidence of DVT seen on physical exam. 1-2+ edema   Recent Labs  06/12/16 1320 06/13/16 0704  HGB 13.1 9.5*  HCT 37.2 27.3*    Assessment/Plan: Preeclampsia with severe features due to elevated BPs.  BPs continue to be elevated.  Mg stopped this am around 10:30am which was about 24hrs since delivery and pt requesting it to be discontinued prior to initial plan of d/cing at noon. Adequate UOP (2100 overnight) Start Procardia XL 30mg  due to persistently elevated BPs Start pepcid Routine PP care   LOS: 2 days   Krista Solis Y 06/13/2016, 12:46 PM

## 2016-06-13 NOTE — Lactation Note (Signed)
This note was copied from a baby's chart. Lactation Consultation Note  Patient Name: Krista Solis'XToday's Date: 06/13/2016 Reason for consult: Follow-up assessment   Follow up with mom of 28 hour old infant. Infant with 8 BF for 10-30 minutes, 2 BF attempts, 3 voids and 2 stools in the 24 hours preceding this assessment. Infant weight 6 lb 13 oz with 1% weight loss since birth. LATCH Score 7 by bedside RN. Maternal history of PCOS.  Infant was asleep in visitors chest. Mom reports BF is going well. She reports infant is eating about every 2.5-3 hours at first feeding cues. Mom reports she is feeling fuller today and having some tenderness with initial latch. Enc mom to use EBM to nipples post BF and can use olive oil or coconut oil to nipples.   Mom without questions/concerns at this time. Mom is planning to use her sister's PIS motor and was asking about pump parts. Advised her that hospital pump tubing does not fit into PIS and recommended her ordering parts from internet or can purchase at local retailers.   Enc mom to call with questions/concerns prn. Follow up tomorrow.    Maternal Data Formula Feeding for Exclusion: No Does the patient have breastfeeding experience prior to this delivery?: No  Feeding Feeding Type: Breast Fed Length of feed: 20 min  LATCH Score/Interventions                      Lactation Tools Discussed/Used     Consult Status Consult Status: Follow-up Date: 06/14/16 Follow-up type: In-patient    Silas FloodSharon S Antwine Agosto 06/13/2016, 2:33 PM

## 2016-06-13 NOTE — Progress Notes (Signed)
Spoke with pt re: c/s for anxiety.  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  Pt has been on medications in the past, but does not take anything currently and unsure if she will need meds, post-d/c.  Pt is familiar with signs/symptoms of PPD and agrees to f/u with MD for any changes in mood/behavior.  No other social work needs identified.  CSW signing off.  Please re-consult as necessary.

## 2016-06-13 NOTE — Progress Notes (Signed)
UR chart review completed.  

## 2016-06-14 LAB — LACTATE DEHYDROGENASE: LDH: 189 U/L (ref 98–192)

## 2016-06-14 LAB — URIC ACID: URIC ACID, SERUM: 5.7 mg/dL (ref 2.3–6.6)

## 2016-06-14 MED ORDER — IBUPROFEN 600 MG PO TABS: 600.0000 mg | ORAL_TABLET | Freq: Four times a day (QID) | ORAL | 0 refills | Status: DC

## 2016-06-14 MED ORDER — NIFEDIPINE ER 30 MG PO TB24: 30.0000 mg | ORAL_TABLET | Freq: Every day | ORAL | 1 refills | Status: DC

## 2016-06-14 NOTE — Progress Notes (Signed)
Pt discharged home ambulatory in stable condition, family out I car. Discharge instructions reviewed, copy given to pt.  Pt instructed to pick Rx's from pharmacy.  Pt verbalized understanding and agreeable.

## 2016-06-14 NOTE — Lactation Note (Signed)
This note was copied from a baby's chart. Lactation Consultation Note  Patient Name: Krista Solis [id number]'DToday's Date: 06/14/2016 Reason for consult: Follow-up assessment   Follow up with first time mom of 48 hour old infant. Infant with 10 BF fpr 10-30 minutes, 4 attempts, 5 voids and 2 stools in 24 hours preceding this assessment. LATCH Scores 8-9 by bedside RN's. Infant weight 6 lb 8.8 oz with 5% weight loss since delivery. Mom with history of PCOS.   Mom reports her breasts do not feel fuller. She reports BF is going better. She is experiencing some nipple tenderness with feeding, mom is using coconut oil to nipples post BF. She reports infant cluster feeds at times and spaces out other feeds, advised this is normal as long as infant is receiving 8-12 feeds in 24 hours.   Reviewed all BF information in Taking Care of Baby and Me Booklet. Reviewed engorgement prevention/treatment. Reviewed I/O and enc mom to maintain feeding log and take to Ped appt. Infant with f/u Ped appt tomorrow. [facility], mom aware of OP Services, BF Support Groups and LC phone #. Enc mom to call with questions/concerns prn.    Maternal Data Formula Feeding for Exclusion: No Does the patient have breastfeeding experience prior to this delivery?: No  Feeding    LATCH Score/Interventions                      Lactation Tools Discussed/Used WIC Program: No (Plans to apply) Pump Review: Milk Storage;Setup, frequency, and cleaning   Consult Status Consult Status: Complete Follow-up type: Call as needed    Ed BlalockSharon S Elfreida Heggs 06/14/2016, 11:15 AM

## 2016-06-14 NOTE — Discharge Summary (Addendum)
Vaginal Delivery Discharge Summary  Krista Solis  DOB:    26-Nov-1990 MRN:    [id number] CSN:    [id number]  Date of admission:                  06/11/2016  Date of discharge:                   06/14/2016  Procedures this admission:  Vacuum asssited vaginal delivery, magnesium sulfate for seizure prophylaxis.    Date of Delivery: 06/12/2016  Newborn Data:  Live born female  Birth Weight: 6 lb 14.1 oz (3121 g) APGAR: 9, 9  Home with mother.   History of Present Illness:  Ms. Krista Solis is a 25 y.o. female, G1P1001, who presents at 5811w5d weeks gestation. The patient has been [facility]. She was admitted for onset of labor. Her pregnancy has been complicated by:  Patient Active Problem List   Diagnosis Date Noted  . Pre-eclampsia, severe 06/12/2016  . Vaginal delivery 06/12/2016  . BMI 50.0-59.9, adult (HCC) 06/12/2016  . Asthma 06/12/2016     Hospital Course:   Vacuum assisted vaginal delivery was performed by Dr. Jaymes GraffNaima Dillard without complication. Patient and baby tolerated the procedure without difficulty, with second degree laceration noted and repaired. Infant status was stable and remained in room with mother.  Mother had preeclampsia with severe features and received intrapartum and postpartum magnesium sulfate.  Infant with breastfeeding going well. Mom's physical exam was WNL, and she was discharged home in stable condition. Contraception plan was Nuvaring  She received adequate benefit from po pain medications.  Her blood pressures were well controlled on Procardia XL.  Patient reported a history of anxiety, she has never been on any anxiety medication use. She reports that she gets anxiety symptoms infrequently.  She declines referral for anxiety management or medication. I reviewed depression and anxiety precautions, will follow-up at the office.   Intrapartum Procedures: vacuum  assisted vaginal delivery Postpartum Procedures: Magnesium sulfate Complications-Operative and Postpartum: Preeclampsia with severe features  Discharge Diagnoses: Term Pregnancy-delivered, Preelampsia and Hypertension   Hemoglobin Results:  CBC Latest Ref Rng & Units 06/13/2016 06/12/2016 06/12/2016  WBC 4.0 - 10.5 K/uL 18.5(H) 16.1(H) 10.4  Hemoglobin 12.0 - 15.0 g/dL 5.7(Q9.5(L) 46.913.1 11.4(L)  Hematocrit 36.0 - 46.0 % 27.3(L) 37.2 33.2(L)  Platelets 150 - 400 K/uL 208 189 139(L)    Discharge Physical Exam:  Vitals:   06/14/16 0114 06/14/16 0641 06/14/16 0935 06/14/16 1202  BP: 136/86 126/70 (!) 158/96 (!) 148/87  Pulse: 85 67 67 78  Resp: 18 16 20 20   Temp: 98.7 F (37.1 C) 97.7 F (36.5 C) 98.2 F (36.8 C) 98.1 F (36.7 C)  TempSrc: Oral Oral Oral Oral  SpO2: 100% 99%    Weight:      Height:       General: alert, cooperative and no distress Lochia: appropriate Uterine Fundus: firm DVT Evaluation: No evidence of DVT seen on physical exam. Calf/Ankle edema is present, right leg has 2+ edema while left leg has 1+ edema.    Discharge Information:  Activity:           pelvic rest Diet:                routine Medications: PNV and Ibuprofen, Procardia 30 mg XL Condition:      stable Instructions:  Routine pp instructions   Discharge to: home  Follow-up Information  [facility] [place] [facility]. Schedule an appointment as soon as possible for a visit in 1 week(s).   Specialty:  Obstetrics and Gynecology Why:  1 week follow up for blood pressure check.  Contact information: [address] [place] [address] [phone]       [facility]. Schedule an appointment as soon as possible for a visit in 6 week(s).   Why:  6 week postpartum check Contact information: [address] [place] [address] [place] [address] [phone]           Konrad Felix, MD.  06/14/2016 11:45  AM

## 2016-06-14 NOTE — Discharge Instructions (Signed)

## 2016-06-16 ENCOUNTER — Encounter (HOSPITAL_COMMUNITY): Payer: Self-pay | Admitting: Obstetrics and Gynecology

## 2016-06-21 ENCOUNTER — Inpatient Hospital Stay (HOSPITAL_COMMUNITY): Admission: RE | Admit: 2016-06-21 | Payer: PRIVATE HEALTH INSURANCE | Source: Ambulatory Visit

## 2017-10-17 ENCOUNTER — Encounter: Payer: Self-pay | Admitting: Internal Medicine

## 2017-11-07 DIAGNOSIS — L309 Dermatitis, unspecified: Secondary | ICD-10-CM | POA: Insufficient documentation

## 2017-11-07 DIAGNOSIS — B977 Papillomavirus as the cause of diseases classified elsewhere: Secondary | ICD-10-CM | POA: Insufficient documentation

## 2017-11-07 DIAGNOSIS — R768 Other specified abnormal immunological findings in serum: Secondary | ICD-10-CM | POA: Insufficient documentation

## 2017-11-07 DIAGNOSIS — A6000 Herpesviral infection of urogenital system, unspecified: Secondary | ICD-10-CM | POA: Insufficient documentation

## 2017-11-07 DIAGNOSIS — E282 Polycystic ovarian syndrome: Secondary | ICD-10-CM | POA: Insufficient documentation

## 2017-11-07 DIAGNOSIS — N879 Dysplasia of cervix uteri, unspecified: Secondary | ICD-10-CM | POA: Insufficient documentation

## 2017-11-07 DIAGNOSIS — Z9884 Bariatric surgery status: Secondary | ICD-10-CM | POA: Insufficient documentation

## 2017-11-08 ENCOUNTER — Ambulatory Visit (INDEPENDENT_AMBULATORY_CARE_PROVIDER_SITE_OTHER): Payer: 59 | Admitting: Internal Medicine

## 2017-11-08 ENCOUNTER — Encounter: Payer: Self-pay | Admitting: Internal Medicine

## 2017-11-08 DIAGNOSIS — Z9884 Bariatric surgery status: Secondary | ICD-10-CM | POA: Diagnosis not present

## 2017-11-08 DIAGNOSIS — B977 Papillomavirus as the cause of diseases classified elsewhere: Secondary | ICD-10-CM

## 2017-11-08 DIAGNOSIS — N879 Dysplasia of cervix uteri, unspecified: Secondary | ICD-10-CM | POA: Diagnosis not present

## 2017-11-08 DIAGNOSIS — L309 Dermatitis, unspecified: Secondary | ICD-10-CM

## 2017-11-08 DIAGNOSIS — E282 Polycystic ovarian syndrome: Secondary | ICD-10-CM | POA: Diagnosis not present

## 2017-11-08 DIAGNOSIS — R768 Other specified abnormal immunological findings in serum: Secondary | ICD-10-CM | POA: Diagnosis not present

## 2017-11-08 DIAGNOSIS — A6000 Herpesviral infection of urogenital system, unspecified: Secondary | ICD-10-CM

## 2017-11-08 NOTE — Assessment & Plan Note (Signed)
She recently tested positive for hepatitis C antibody but her hepatitis C viral load was undetectable.  There is a possibility that her hepatitis C antibody was falsely positive or it could be that she was infected but had spontaneous clearing of her infection and is now already cured.  Whichever explanation, she does not treatment.  She cannot spread infection to anyone else.  She can follow-up here as needed.

## 2017-11-08 NOTE — Progress Notes (Signed)
[facility]  Reason for Consult: Hepatitis C Referring Physician: Dr. Dierdre Forth  Assessment: She recently tested positive for hepatitis C antibody but her hepatitis C viral load was undetectable.  There is a possibility that her hepatitis C antibody was falsely positive or it could be that she was infected but had spontaneous clearing of her infection and is now already cured.  Whichever explanation, she does not treatment.  She cannot spread infection to anyone else.  She can follow-up here as needed.   Plan: 1. No further testing or treatment needed for hepatitis C 2. She can follow-up here as needed  Patient Active Problem List   Diagnosis Date Noted  . Hepatitis C antibody test positive 11/07/2017    Priority: High  . Polycystic ovaries 11/07/2017  . Eczema 11/07/2017  . 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 Genital herpes 11/07/2017  . 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 BMI 50.0-59.9, adult (HCC) 06/12/2016  . Asthma 06/12/2016    Patient's Medications  New Prescriptions   No medications on file  Previous Medications   ACETAMINOPHEN (TYLENOL) 325 MG TABLET    Take 650 mg by mouth 2 (two) times daily as needed for mild pain.    ALBUTEROL (PROVENTIL HFA;VENTOLIN HFA) 108 (90 BASE) MCG/ACT INHALER    Inhale 1 puff into the lungs every 6 (six) hours as needed for wheezing or shortness of breath.   CALCIUM CARBONATE (TUMS - DOSED IN MG ELEMENTAL CALCIUM) 500 MG CHEWABLE TABLET    Chew 3 tablets by mouth 3 (three) times daily as needed for indigestion or heartburn.   EMOLLIENT (CETAPHIL) CREAM    Apply 1 application topically daily as needed (for eczema).   IBUPROFEN (ADVIL,MOTRIN) 600 MG TABLET    Take 1 tablet (600 mg total) by mouth every 6 (six) hours.   NIFEDIPINE (PROCARDIA-XL/ADALAT CC) 30 MG 24 HR TABLET    Take 1 tablet (30 mg total) by  mouth daily.   PRENATAL VIT-FE FUMARATE-FA (PRENATAL MULTIVITAMIN) TABS TABLET    Take 1 tablet by mouth daily at 12 noon.   TRIAMCINOLONE CREAM (KENALOG) 0.1 %    Apply 1 application topically 2 (two) times daily as needed (for eczema).  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  She is referred to me after recently testing positive for hepatitis C antibody.  She states she has been very worried about this visit.  She says that she does not know how she could have contracted hepatitis C.  She has been very worried that she might have spread it to her 12-month-old daughter or fianc.  She has never had a blood transfusion.  She does not have any tattoos.  She denies any history of injecting drug use.  She has 10 lifetime sexual partners, all female.  She is not sure if any of them were hepatitis C positive.  She states that her grandmother died several years ago.  Following her death her family discovered that she had been hepatitis C positive.  She tells me that she frequently stayed with her grandmother and often used her razor.  She also had a hepatitis C positive roommate several years ago.  She  does not believe that her current fianc has been tested yet.  Review of Systems: Review of Systems  Constitutional: Negative for chills, diaphoresis, fever, malaise/fatigue and weight loss.  HENT: Negative for congestion and sore throat.   Respiratory: Negative for cough, sputum production and shortness of breath.   Cardiovascular: Negative for chest pain.  Gastrointestinal: Negative for abdominal pain, diarrhea, heartburn, nausea and vomiting.  Genitourinary: Negative for dysuria and frequency.  Musculoskeletal: Negative for joint pain and myalgias.  Skin: Negative for rash.  Neurological: Negative for dizziness and headaches.   Psychiatric/Behavioral: Negative for depression and substance abuse. The patient is not nervous/anxious.       Past Medical History:  Diagnosis Date  . Anxiety   . Asthma   . Medical history non-contributory   . PCOS (polycystic ovarian syndrome)     Social History   Tobacco Use  . Smoking status: Never Smoker  . Smokeless tobacco: Never Used  Substance Use Topics  . Alcohol use: No  . Drug use: No    No family history on file. Allergies  Allergen Reactions  . Latex Swelling  . Morphine And Related Nausea And Vomiting    OBJECTIVE: Vitals:   11/08/17 0909  BP: 133/71  Pulse: 69  Temp: 98.3 F (36.8 C)  TempSrc: Oral  Weight: (!) 318 lb (144.2 kg)   Body mass index is 51.33 kg/m.   Physical Exam  Constitutional: She is oriented to person, place, and time.  She is tearful on occasion to the exam but otherwise is in good spirits.  HENT:  Mouth/Throat: No oropharyngeal exudate.  Cardiovascular: Normal rate and regular rhythm.  No murmur heard. Pulmonary/Chest: Effort normal. No respiratory distress. She has no wheezes.  Abdominal: Soft. She exhibits no distension and no mass. There is no tenderness.  Neurological: She is alert and oriented to person, place, and time.  Skin: No rash noted.  Psychiatric: Mood and affect normal.    Microbiology: No results found for this or any previous visit (from the past 240 hour(s)).  Cliffton AstersJohn Elga Santy, MD [facility] [phone] pager   [phone] cell 11/08/2017, 9:28 AM

## 2017-12-12 ENCOUNTER — Other Ambulatory Visit: Payer: Self-pay | Admitting: Obstetrics and Gynecology

## 2018-04-03 DIAGNOSIS — M5416 Radiculopathy, lumbar region: Secondary | ICD-10-CM | POA: Insufficient documentation

## 2018-04-03 DIAGNOSIS — M545 Low back pain, unspecified: Secondary | ICD-10-CM | POA: Insufficient documentation

## 2019-01-02 ENCOUNTER — Encounter: Payer: Self-pay | Admitting: Emergency Medicine

## 2019-01-02 ENCOUNTER — Other Ambulatory Visit: Payer: Self-pay

## 2019-01-02 ENCOUNTER — Ambulatory Visit
Admission: EM | Admit: 2019-01-02 | Discharge: 2019-01-02 | Disposition: A | Discharge status: Home / Self Care | Payer: BLUE CROSS/BLUE SHIELD | Attending: Urgent Care | Admitting: Urgent Care

## 2019-01-02 DIAGNOSIS — R509 Fever, unspecified: Secondary | ICD-10-CM

## 2019-01-02 DIAGNOSIS — R51 Headache: Secondary | ICD-10-CM

## 2019-01-02 DIAGNOSIS — R100 Acute abdomen: Secondary | ICD-10-CM

## 2019-01-02 DIAGNOSIS — R109 Unspecified abdominal pain: Secondary | ICD-10-CM | POA: Diagnosis not present

## 2019-01-02 DIAGNOSIS — K529 Noninfective gastroenteritis and colitis, unspecified: Secondary | ICD-10-CM | POA: Diagnosis not present

## 2019-01-02 DIAGNOSIS — R112 Nausea with vomiting, unspecified: Secondary | ICD-10-CM

## 2019-01-02 DIAGNOSIS — Z3202 Encounter for pregnancy test, result negative: Secondary | ICD-10-CM

## 2019-01-02 DIAGNOSIS — R197 Diarrhea, unspecified: Secondary | ICD-10-CM

## 2019-01-02 LAB — COMPREHENSIVE METABOLIC PANEL
ALT: 19 U/L (ref 0–44)
AST: 25 U/L (ref 15–41)
Albumin: 3.6 g/dL (ref 3.5–5.0)
Alkaline Phosphatase: 69 U/L (ref 38–126)
Anion gap: 5 (ref 5–15)
BUN: 17 mg/dL (ref 6–20)
CO2: 25 mmol/L (ref 22–32)
Calcium: 8.1 mg/dL — ABNORMAL LOW (ref 8.9–10.3)
Chloride: 105 mmol/L (ref 98–111)
Creatinine, Ser: 0.77 mg/dL (ref 0.44–1.00)
GFR calc Af Amer: >60 mL/min (ref >60)
GFR calc non Af Amer: >60 mL/min (ref >60)
Glucose, Bld: 92 mg/dL (ref 70–99)
Potassium: 4.1 mmol/L (ref 3.5–5.1)
Sodium: 135 mmol/L (ref 135–145)
Total Bilirubin: 0.6 mg/dL (ref 0.3–1.2)
Total Protein: 7.5 g/dL (ref 6.5–8.1)

## 2019-01-02 LAB — CBC WITH DIFFERENTIAL/PLATELET
Abs Immature Granulocytes: 0.01 10*3/uL (ref 0.00–0.07)
Basophils Absolute: 0 10*3/uL (ref 0.0–0.1)
Basophils Relative: 0 %
Eosinophils Absolute: 0 10*3/uL (ref 0.0–0.5)
Eosinophils Relative: 0 %
HCT: 38.7 % (ref 36.0–46.0)
Hemoglobin: 12.4 g/dL (ref 12.0–15.0)
Immature Granulocytes: 0 %
Lymphocytes Relative: 14 %
Lymphs Abs: 0.6 10*3/uL — ABNORMAL LOW (ref 0.7–4.0)
MCH: 27.7 pg (ref 26.0–34.0)
MCHC: 32 g/dL (ref 30.0–36.0)
MCV: 86.6 fL (ref 80.0–100.0)
Monocytes Absolute: 0.3 10*3/uL (ref 0.1–1.0)
Monocytes Relative: 7 %
Neutro Abs: 3.6 10*3/uL (ref 1.7–7.7)
Neutrophils Relative %: 79 %
Platelets: 262 10*3/uL (ref 150–400)
RBC: 4.47 MIL/uL (ref 3.87–5.11)
RDW: 13.1 % (ref 11.5–15.5)
WBC: 4.6 10*3/uL (ref 4.0–10.5)
nRBC: 0 % (ref 0.0–0.2)

## 2019-01-02 LAB — LIPASE, BLOOD: Lipase: 38 U/L (ref 11–51)

## 2019-01-02 LAB — PREGNANCY, URINE: Preg Test, Ur: NEGATIVE

## 2019-01-02 LAB — URINALYSIS, COMPLETE (UACMP) WITH MICROSCOPIC
Bilirubin Urine: NEGATIVE
Glucose, UA: NEGATIVE mg/dL
Hgb urine dipstick: NEGATIVE
Ketones, ur: NEGATIVE mg/dL
Leukocytes,Ua: NEGATIVE
Nitrite: NEGATIVE
Protein, ur: NEGATIVE mg/dL
Specific Gravity, Urine: 1.02 (ref 1.005–1.030)
pH: 6.5 (ref 5.0–8.0)

## 2019-01-02 MED ORDER — DICYCLOMINE HCL 20 MG PO TABS: 20.0000 mg | ORAL_TABLET | Freq: Two times a day (BID) | ORAL | 0 refills | Status: DC | PRN

## 2019-01-02 MED ORDER — LOPERAMIDE HCL 2 MG PO CAPS: 2.0000 mg | ORAL_CAPSULE | Freq: Four times a day (QID) | ORAL | 0 refills | Status: DC | PRN

## 2019-01-02 MED ORDER — ONDANSETRON 4 MG PO TBDP: 4.0000 mg | ORAL_TABLET | Freq: Three times a day (TID) | ORAL | 0 refills | Status: DC | PRN

## 2019-01-02 NOTE — ED Triage Notes (Signed)
Patient c/o abdominal pain, diarrhea and vomiting that started last night. Patient also reports chills last night.

## 2019-01-02 NOTE — ED Provider Notes (Signed)
[address] [place], [place] [address] [phone]    Arrival date and time:  01/02/19 1112  Chief Complaint:  Emesis and Diarrhea  NOTE: Prior to seeing the patient today, I have reviewed the triage nursing documentation and vital signs. Clinical staff has updated patient's PMH/PSHx, current medication list, and drug allergies/intolerances to ensure comprehensive history available to assist in medical decision making.   History:   HPI: Krista Solis is a 28 y.o. female who presents today with complaints of N/V/D that began with acute onset last night. She has vomited x 2 and had 6-7 episodes of diarrhea. She has experienced low grade temperatures (Tmax 99.7). (+) chills. Oral intake has been decreased overall. Patient with RIGHT upper and lower abdominal pain that she describes as a "cramping sensation". She denies overt urinary symptoms, however does note some pain in her RIGHT lower back. Patient has a non-specific headache.   PSHx (+) for sleeve gastrectomy. Patient states, "they always told me that I was at risk for gallbladder problems after my surgery".   Past Medical History:  Diagnosis Date  . Anxiety   . Asthma   . Medical history non-contributory   . PCOS (polycystic ovarian syndrome)     Past Surgical History:  Procedure Laterality Date  . LAPAROSCOPIC GASTRIC SLEEVE RESECTION  2014  . VAGINAL DELIVERY  06/12/2016   Procedure: VAGINAL DELIVERY in OR;  Surgeon: Jaymes Graff, MD;  Location: [facility];  Service: Obstetrics;;  Pt took to OR for C-section for failure to progress but due to delay in time after epidural dosing patient was repoisitoned and baby's head rotated to deliver vaginal once in the OR   . WISDOM TOOTH EXTRACTION      History reviewed. No pertinent family history.  Social History   Socioeconomic History  . Marital status: Single    Spouse name: Not on file  . Number of children: Not on file  . Years of education: Not on  file  . Highest education level: Not on file  Occupational History  . Not on file  Social Needs  . Financial resource strain: Not on file  . Food insecurity:    Worry: Not on file    Inability: Not on file  . Transportation needs:    Medical: Not on file    Non-medical: Not on file  Tobacco Use  . Smoking status: Never Smoker  . Smokeless tobacco: Never Used  Substance and Sexual Activity  . Alcohol use: No  . Drug use: No  . Sexual activity: Not on file  Lifestyle  . Physical activity:    Days per week: Not on file    Minutes per session: Not on file  . Stress: Not on file  Relationships  . Social connections:    Talks on phone: Not on file    Gets together: Not on file    Attends religious service: Not on file    Active member of club or organization: Not on file    Attends meetings of clubs or organizations: Not on file    Relationship status: Not on file  . Intimate partner violence:    Fear of current or ex partner: Not on file    Emotionally abused: Not on file    Physically abused: Not on file    Forced sexual activity: Not on file  Other Topics Concern  . Not on file  Social History Narrative  . Not on file    Patient  Active Problem List   Diagnosis Date Noted  . Hepatitis C antibody test positive 11/07/2017  . Polycystic ovaries 11/07/2017  . Eczema 11/07/2017  . 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 Genital herpes 11/07/2017  . 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 BMI 50.0-59.9, adult (HCC) 06/12/2016  . Asthma 06/12/2016    Home Medications:    Current Meds  Medication Sig  . albuterol (PROVENTIL HFA;VENTOLIN HFA) 108 (90 Base) MCG/ACT inhaler Inhale 1 puff into the lungs every 6 (six) hours as needed for wheezing or shortness of breath.    Allergies:   Latex and Morphine and related  Review of Systems (ROS): Review of Systems  Constitutional:  Positive for appetite change (decreased) and fever ( +low grade temperatures). Negative for chills.  HENT: Negative for congestion, ear pain, rhinorrhea and sore throat.   Respiratory: Negative for cough and shortness of breath.   Cardiovascular: Negative for chest pain and palpitations.  Gastrointestinal: Positive for abdominal pain (RUQ/RLQ), diarrhea, nausea and vomiting.  Genitourinary: Negative for dysuria, hematuria and urgency.  Musculoskeletal: Positive for back pain (RIGHT lower). Negative for arthralgias.  Skin: Negative for color change and rash.  Neurological: Positive for headaches. Negative for dizziness, seizures and syncope.  Hematological: Negative for adenopathy.  All other systems reviewed and are negative.    Physical Exam:  Triage Vital Signs ED Triage Vitals  Enc Vitals Group     BP 01/02/19 1127 138/81     Pulse Rate 01/02/19 1127 (!) 102     Resp 01/02/19 1127 18     Temp 01/02/19 1127 99.7 F (37.6 C)     Temp Source 01/02/19 1127 Oral     SpO2 01/02/19 1127 99 %     Weight 01/02/19 1128 (!) 310 lb (140.6 kg)     Height 01/02/19 1128 5\' 6"  (1.676 m)     Head Circumference --      Peak Flow --      Pain Score 01/02/19 1128 5     Pain Loc --      Pain Edu? --      Excl. in GC? --     Physical Exam  Constitutional: She is oriented to person, place, and time and well-developed, well-nourished, and in no distress.  HENT:  Head: Normocephalic and atraumatic.  Mouth/Throat: Oropharynx is clear and moist and mucous membranes are normal.  Eyes: Pupils are equal, round, and reactive to light. EOM are normal.  Neck: Normal range of motion. Neck supple. No tracheal deviation present.  Cardiovascular: Regular rhythm, normal heart sounds and intact distal pulses. Tachycardia present. Exam reveals no gallop and no friction rub.  No murmur heard. Pulmonary/Chest: Effort normal and breath sounds normal. No respiratory distress. She has no wheezes. She has no rales.   Abdominal: Soft. Normal appearance and bowel sounds are normal. She exhibits no ascites. There is no hepatosplenomegaly. There is abdominal tenderness in the right upper quadrant and right lower quadrant. There is no CVA tenderness.  Neurological: She is alert and oriented to person, place, and time.  Skin: Skin is warm and dry. No rash noted. No erythema.  Psychiatric: Mood, affect and judgment normal.  Nursing note and vitals reviewed.    Urgent Care Treatments / Results:   LABS: PLEASE NOTE: all labs ordered are listed, but only abnormal results are displayed. Labs Reviewed  URINALYSIS, COMPLETE (UACMP) WITH MICROSCOPIC - Abnormal; Notable for the following components:  Result Value   Bacteria, UA RARE (*)    All other components within normal limits  CBC WITH DIFFERENTIAL/PLATELET - Abnormal; Notable for the following components:   Lymphs Abs 0.6 (*)    All other components within normal limits  COMPREHENSIVE METABOLIC PANEL - Abnormal; Notable for the following components:   Calcium 8.1 (*)    All other components within normal limits  LIPASE, BLOOD  PREGNANCY, URINE    RADIOLOGY: No results found.  Procedures  Medications - No data to display  Clinical Course as of Jan 01 1257  Tue Jan 02, 2019  1150 Patient requesting to hold on blood labs until UA results. Sample has been sent.    [BG]  1212 UA resulted NEGATIVE. Labs obtained by NP from [facility] x 1 attempt and sent for testing.    [BG]    Clinical Course User Index [BG] Verlee Monte, NP    Initial Impression / Assessment and Plan / Urgent Care Course:   Pertinent labs & imaging results that were available during my care of the patient were personally reviewed by me and considered in my medical decision making (see chart for details).   DANYEAL AKENS is a 27 y.o. female who presents to [facility] today with complaints of RIGHT sided abdominal pain with (+) associated N/V/D that began with acute  onset on 01/01/2019. Patient denies fevers (Tmax 99.7). No overt urinary symptoms. She has pain in the RIGHT lower back. Exam reveals diffuse RIGHT abdominal tenderness; no flank pain or CVAT.   Labs reviewed as stable with the exception of her calcium level of 8.1mg /dL.  Patient advised that symptoms consistent with an acute viral illness. Will provide antiemetics (ondansetron), antidiarrheals (loperamide), and a medication to help with her abdominal spasms (dicyclomine). Patient was encouraged to rest and ensure adequate hydration. She was advised to incorporate electrolyte enriched fluids into her daily intake. She can use APAP on a PRN basis for her headache; avoid NSAIDs s/p VSG. Encouraged to add calcium supplement daily; wishes to speak with PCP as she has never had issues with her calcium. With rest, hydration, and use of prescribed interventions I anticipate that patient will begin to feel better soon.    Discussed follow up with primary care physician this week for re-evaluation. I have reviewed the follow up and strict return precautions for any new or worsening symptoms. Patient is aware of symptoms that would be deemed urgent/emergent, and would thus require further evaluation either here or in the emergency department. At the time of discharge, she verbalized understanding and consent with the discharge plan as it was reviewed with her. All questions were fielded by provider and/or clinic staff prior to patient discharge.    Final Clinical Impressions(s) / Urgent Care Diagnoses:   Final diagnoses:  Nausea vomiting and diarrhea  Abdominal pain, unspecified abdominal location  Gastroenteritis  Hypocalcemia    New Prescriptions:   Meds ordered this encounter  Medications  . ondansetron (ZOFRAN-ODT) 4 MG disintegrating tablet    Sig: Take 1 tablet (4 mg total) by mouth every 8 (eight) hours as needed for nausea or vomiting.    Dispense:  15 tablet    Refill:  0  . dicyclomine  (BENTYL) 20 MG tablet    Sig: Take 1 tablet (20 mg total) by mouth 2 (two) times daily as needed for spasms.    Dispense:  10 tablet    Refill:  0  . loperamide (IMODIUM) 2 MG  capsule    Sig: Take 1 capsule (2 mg total) by mouth 4 (four) times daily as needed for diarrhea or loose stools.    Dispense:  12 capsule    Refill:  0    Controlled Substance Prescriptions:  [place] Controlled Substance Registry consulted? Not Applicable  Note: This note was prepared using Dragon dictation software along with smaller phrase technology. Despite my best ability to proofread, there is the potential that transcriptional errors may still occur from this process, and are completely unintentional.     Verlee MonteGray, Nachelle Negrette E, NP 01/02/19 1258

## 2019-01-02 NOTE — Discharge Instructions (Signed)
It was very nice meeting you today in clinic. Thank you for entrusting me with your care.   I feel that your symptoms are viral in nature. I have given you some information to read on gastroenteritis. Please utilize the medications that we discussed. Your prescriptions have been called in to your pharmacy. Be sure to increase your fluid intake. Water and things like Gatorade are the best to prevent dehydration and electrolyte losses. May use Tylenol for your headache. Remember to start that calcium supplement, or at least talk to your regular doctor about it.   Make arrangements to follow up with your regular doctor and/or the specialist for re-evaluation and further treatment as dicussed. If your symptoms/condition worsens, please seek follow up care either here or in the ER. Please remember, our [facility] providers are "right here with you" when you need [place].   Again, it was my pleasure to take care of you today. Thank you for choosing our clinic. I hope that you start to feel better quickly.   Quentin Mulling, MSN, APRN, FNP-C, CEN Advanced Practice Provider [facility]

## 2020-02-20 ENCOUNTER — Ambulatory Visit
Admission: EM | Admit: 2020-02-20 | Discharge: 2020-02-20 | Disposition: A | Discharge status: Home / Self Care | Payer: BC Managed Care – PPO | Attending: Family Medicine | Admitting: Family Medicine

## 2020-02-20 ENCOUNTER — Encounter: Payer: Self-pay | Admitting: Emergency Medicine

## 2020-02-20 ENCOUNTER — Other Ambulatory Visit: Payer: Self-pay

## 2020-02-20 DIAGNOSIS — J020 Streptococcal pharyngitis: Secondary | ICD-10-CM

## 2020-02-20 LAB — GROUP A STREP BY PCR: Group A Strep by PCR: DETECTED — AB

## 2020-02-20 MED ORDER — LIDOCAINE VISCOUS HCL 2 % MT SOLN: OROMUCOSAL | 0 refills | Status: DC

## 2020-02-20 MED ORDER — KETOROLAC TROMETHAMINE 10 MG PO TABS: 10.0000 mg | ORAL_TABLET | Freq: Four times a day (QID) | ORAL | 0 refills | Status: DC | PRN

## 2020-02-20 MED ORDER — PENICILLIN G BENZATHINE 1200000 UNIT/2ML IM SUSP
1.2000 10*6.[IU] | Freq: Once | INTRAMUSCULAR | Status: AC
  Administered 2020-02-20: 1.2 10*6.[IU] via INTRAMUSCULAR

## 2020-02-20 NOTE — ED Provider Notes (Signed)
[facility]    CSN: [id number] Arrival date & time: 02/20/20  4098  History   Chief Complaint Chief Complaint  Patient presents with  . Sore Throat   HPI   29 year old female presents with sore throat.  Patient reports that she developed sore throat yesterday.  Has continued to persist and it has actually worsened.  She has taken DayQuil and Tylenol without relief.  Patient states that she has had associated chills.  She rates her pain as 9/10 in severity.  Reports difficulty swallowing.  No reported sick contacts.  Patient reports that she has a history of recurrent pharyngitis.  She states that this feels similar to prior instances.  No other associated symptoms.  Past Medical History:  Diagnosis Date  . Anxiety   . Asthma   . Medical history non-contributory   . PCOS (polycystic ovarian syndrome)     Patient Active Problem List   Diagnosis Date Noted  . Hepatitis C antibody test positive 11/07/2017  . Polycystic ovaries 11/07/2017  . Eczema 11/07/2017  . 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 Genital herpes 11/07/2017  . 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 BMI 50.0-59.9, adult (HCC) 06/12/2016  . Asthma 06/12/2016    Past Surgical History:  Procedure Laterality Date  . LAPAROSCOPIC GASTRIC SLEEVE RESECTION  2014  . VAGINAL DELIVERY  06/12/2016   Procedure: VAGINAL DELIVERY in OR;  Surgeon: Jaymes Graff, MD;  [facility]: [facility];  Service: Obstetrics;;  Pt took to OR for C-section for failure to progress but due to delay in time after epidural dosing patient was repoisitoned and baby's head rotated to deliver vaginal once in the OR   . WISDOM TOOTH EXTRACTION      OB History    Gravida  1   Para  1   Term  1   Preterm      AB      Living  1     SAB      TAB      Ectopic      Multiple  0   Live Births  1            Home  Medications    Prior to Admission medications   Medication Sig Start Date End Date Taking? Authorizing Provider  albuterol (PROVENTIL HFA;VENTOLIN HFA) 108 (90 Base) MCG/ACT inhaler Inhale 1 puff into the lungs every 6 (six) hours as needed for wheezing or shortness of breath.   Yes [provider]  lisdexamfetamine (VYVANSE) 70 MG capsule Take 70 mg by mouth daily.   Yes [provider]  ketorolac (TORADOL) 10 MG tablet Take 1 tablet (10 mg total) by mouth every 6 (six) hours as needed for moderate pain or severe pain. 02/20/20   Tommie Sams, DO  lidocaine (XYLOCAINE) 2 % solution Gargle 15 mL every 3 hours as needed. May swallow if desired. 02/20/20   Tommie Sams, DO  dicyclomine (BENTYL) 20 MG tablet Take 1 tablet (20 mg total) by mouth 2 (two) times daily as needed for spasms. 01/02/19 02/20/20  Verlee Monte, NP    Family History History reviewed. No pertinent family history.  Social History Social History   Tobacco Use  . Smoking status: Never Smoker  . Smokeless tobacco: Never Used  Substance Use Topics  . Alcohol use: No  . Drug use: No     Allergies   Latex and  Morphine and related   Review of Systems Review of Systems  Constitutional: Positive for chills and fever.  HENT: Positive for sore throat and trouble swallowing.    Physical Exam Triage Vital Signs ED Triage Vitals  Enc Vitals Group     BP 02/20/20 0854 117/86     Pulse Rate 02/20/20 0854 (!) 121     Resp 02/20/20 0854 18     Temp 02/20/20 0854 (!) 100.6 F (38.1 C)     Temp Source 02/20/20 0854 Oral     SpO2 02/20/20 0854 98 %     Weight 02/20/20 0851 (!) 315 lb (142.9 kg)     Height 02/20/20 0851 5\' 7"  (1.702 m)     Head Circumference --      Peak Flow --      Pain Score 02/20/20 0851 9     Pain Loc --      Pain Edu? --      Excl. in [place]? --    No data found.  Updated Vital Signs BP 117/86 (BP Location: Right Arm)   Pulse (!) 121   Temp (!) 100.6 F (38.1 C) (Oral)    Resp 18   Ht 5\' 7"  (1.702 m)   Wt (!) 142.9 kg   SpO2 98%   BMI 49.34 kg/m   Visual Acuity Right Eye Distance:   Left Eye Distance:   Bilateral Distance:    Right Eye Near:   Left Eye Near:    Bilateral Near:     Physical Exam Constitutional:      General: She is not in acute distress.    Appearance: Normal appearance. She is obese. She is not ill-appearing.  HENT:     Head: Normocephalic and atraumatic.     Mouth/Throat:     Pharynx: Oropharyngeal exudate and posterior oropharyngeal erythema present.  Eyes:     General:        Right eye: No discharge.        Left eye: No discharge.     Conjunctiva/sclera: Conjunctivae normal.  Cardiovascular:     Rate and Rhythm: Regular rhythm. Tachycardia present.  Pulmonary:     Effort: Pulmonary effort is normal. No respiratory distress.  Neurological:     Mental Status: She is alert.  Psychiatric:        Mood and Affect: Mood normal.        Behavior: Behavior normal.    UC Treatments / Results  Labs (all labs ordered are listed, but only abnormal results are displayed) Labs Reviewed  GROUP A STREP BY PCR - Abnormal; Notable for the following components:      Result Value   Group A Strep by PCR DETECTED (*)    All other components within normal limits    EKG   Radiology No results found.  Procedures Procedures (including critical care time)  Medications Ordered in UC Medications  penicillin g benzathine (BICILLIN LA) 1200000 UNIT/2ML injection 1.2 Million Units (1.2 Million Units Intramuscular Given 02/20/20 0917)    Initial Impression / Assessment and Plan / UC Course  I have reviewed the triage vital signs and the nursing notes.  Pertinent labs & imaging results that were available during my care of the patient were reviewed by me and considered in my medical decision making (see chart for details).    29 year old female presents with strep pharyngitis.  Acute illness with systemic symptoms (patient  currently febrile). Treating with Bicillin per patient request.  Toradol and  viscous lidocaine for pain.  Supportive care.  Final Clinical Impressions(s) / UC Diagnoses   Final diagnoses:  Strep pharyngitis   Discharge Instructions   None    ED Prescriptions    Medication Sig Dispense Auth. Provider   ketorolac (TORADOL) 10 MG tablet Take 1 tablet (10 mg total) by mouth every 6 (six) hours as needed for moderate pain or severe pain. 20 tablet Cook, Jayce G, DO   lidocaine (XYLOCAINE) 2 % solution Gargle 15 mL every 3 hours as needed. May swallow if desired. 200 mL Tommie Sams, DO     PDMP not reviewed this encounter.   Everlene Other [place], [place] 02/20/20 [phone]

## 2020-02-20 NOTE — ED Triage Notes (Signed)
Patient c/o sore throat that started yesterday. Patient states she has a history of strep throat.

## 2020-02-22 ENCOUNTER — Encounter: Payer: Self-pay | Admitting: Emergency Medicine

## 2020-02-22 ENCOUNTER — Ambulatory Visit
Admission: EM | Admit: 2020-02-22 | Discharge: 2020-02-22 | Disposition: A | Discharge status: Home / Self Care | Payer: BC Managed Care – PPO | Attending: Family Medicine | Admitting: Family Medicine

## 2020-02-22 ENCOUNTER — Other Ambulatory Visit: Payer: Self-pay

## 2020-02-22 DIAGNOSIS — J020 Streptococcal pharyngitis: Secondary | ICD-10-CM | POA: Diagnosis not present

## 2020-02-22 MED ORDER — CEFDINIR 300 MG PO CAPS: 300.0000 mg | ORAL_CAPSULE | Freq: Two times a day (BID) | ORAL | 0 refills | Status: DC

## 2020-02-22 NOTE — Discharge Instructions (Signed)
Lots of fluids.  If does not improve in the next 24 hours, start the oral antibiotic. If you continue to worsen or not improve, see ENT or go to the ER.  Take care  Dr. Adriana Simas

## 2020-02-22 NOTE — ED Provider Notes (Signed)
[facility]    CSN: [id number] Arrival date & time: 02/22/20  1102      History   Chief Complaint Chief Complaint  Patient presents with  . Sore Throat   HPI  29 year old female presents with persistent sore throat.  Patient seen on 5/26.  Strep PCR positive.  Patient given Bicillin per her request.  Patient treated symptomatically with viscous lidocaine and Toradol.  Patient presents today reporting no improvement in her symptoms.  She continues to have severe sore throat and difficulty swallowing.  She states that she has had strep throat before and typically has improved by now.  Patient is requesting an additional note for work.  She is unsure if anything else can be done regarding the lack of improvement in her symptoms.  No current fever.  No other reported symptoms.  No other complaints.  Past Medical History:  Diagnosis Date  . Anxiety   . Asthma   . Medical history non-contributory   . PCOS (polycystic ovarian syndrome)     Patient Active Problem List   Diagnosis Date Noted  . Hepatitis C antibody test positive 11/07/2017  . Polycystic ovaries 11/07/2017  . Eczema 11/07/2017  . 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 Genital herpes 11/07/2017  . 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 BMI 50.0-59.9, adult ([place]) 06/12/2016  . Asthma 06/12/2016    Past Surgical History:  Procedure Laterality Date  . [place] RESECTION  2014  . VAGINAL DELIVERY  06/12/2016   Procedure: VAGINAL DELIVERY in OR;  Surgeon: Crawford Givens, MD;  Location: [place];  Service: Obstetrics;;  Pt took to OR for C-section for failure to progress but due to delay in time after epidural dosing patient was repoisitoned and baby's head rotated to deliver vaginal once in the OR   . WISDOM TOOTH EXTRACTION      OB History    Gravida  1   Para  1   Term  1   Preterm        AB      Living  1     SAB      TAB      Ectopic      Multiple  0   Live Births  1            Home Medications    Prior to Admission medications   Medication Sig Start Date End Date Taking? Authorizing Provider  ketorolac (TORADOL) 10 MG tablet Take 1 tablet (10 mg total) by mouth every 6 (six) hours as needed for moderate pain or severe pain. 02/20/20  Yes Marquia Costello G, DO  lidocaine (XYLOCAINE) 2 % solution Gargle 15 mL every 3 hours as needed. May swallow if desired. 02/20/20  Yes Breylan Lefevers G, DO  albuterol (PROVENTIL HFA;VENTOLIN HFA) 108 (90 Base) MCG/ACT inhaler Inhale 1 puff into the lungs every 6 (six) hours as needed for wheezing or shortness of breath.    [provider]  cefdinir (OMNICEF) 300 MG capsule Take 1 capsule (300 mg total) by mouth 2 (two) times daily. 02/22/20   Coral Spikes, DO  lisdexamfetamine (VYVANSE) 70 MG capsule Take 70 mg by mouth daily.    [provider]  dicyclomine (BENTYL) 20 MG tablet Take 1 tablet (20 mg total) by mouth 2 (two) times daily as needed for spasms. 01/02/19 02/20/20  Karen Kitchens, NP    Family  History History reviewed. No pertinent family history.  Social History Social History   Tobacco Use  . Smoking status: Never Smoker  . Smokeless tobacco: Never Used  Substance Use Topics  . Alcohol use: No  . Drug use: No     Allergies   Latex and Morphine and related   Review of Systems Review of Systems  Constitutional: Negative for fever.  HENT: Positive for sore throat and trouble swallowing.    Physical Exam Triage Vital Signs ED Triage Vitals  Enc Vitals Group     BP 02/22/20 1117 (!) 134/92     Pulse Rate 02/22/20 1117 79     Resp 02/22/20 1117 16     Temp 02/22/20 1117 98.5 F (36.9 C)     Temp Source 02/22/20 1117 Oral     SpO2 02/22/20 1117 96 %     Weight 02/22/20 1115 (!) 315 lb 0.6 oz (142.9 kg)     Height 02/22/20 1115 5\' 7"  (1.702 m)     Head Circumference --      Peak  Flow --      Pain Score 02/22/20 1114 6     Pain Loc --      Pain Edu? --      Excl. in [place]? --    Updated Vital Signs BP (!) 134/92 (BP Location: Left Arm)   Pulse 79   Temp 98.5 F (36.9 C) (Oral)   Resp 16   Ht 5\' 7"  (1.702 m)   Wt (!) 142.9 kg   SpO2 96%   Breastfeeding No   BMI 49.34 kg/m   Visual Acuity Right Eye Distance:   Left Eye Distance:   Bilateral Distance:    Right Eye Near:   Left Eye Near:    Bilateral Near:     Physical Exam Vitals and nursing note reviewed.  Constitutional:      General: She is not in acute distress.    Appearance: Normal appearance. She is not ill-appearing.  HENT:     Head: Normocephalic and atraumatic.     Mouth/Throat:     Pharynx: Oropharyngeal exudate and posterior oropharyngeal erythema present.  Cardiovascular:     Rate and Rhythm: Normal rate and regular rhythm.  Pulmonary:     Effort: Pulmonary effort is normal. No respiratory distress.  Neurological:     Mental Status: She is alert.  Psychiatric:        Mood and Affect: Mood normal.        Behavior: Behavior normal.    UC Treatments / Results  Labs (all labs ordered are listed, but only abnormal results are displayed) Labs Reviewed - No data to display  EKG   Radiology No results found.  Procedures Procedures (including critical care time)  Medications Ordered in UC Medications - No data to display  Initial Impression / Assessment and Plan / UC Course  I have reviewed the triage vital signs and the nursing notes.  Pertinent labs & imaging results that were available during my care of the patient were reviewed by me and considered in my medical decision making (see chart for details).    29 year old female presents with strep pharyngitis.  Persistent symptoms without improvement in the past 48 hours.  A literature states that the majority of cases will improve within 3 days.  Advised to give an additional 24 hours and continue symptomatic treatment.   She is to start [facility] if she fails to improve.  Final Clinical Impressions(s) /  UC Diagnoses   Final diagnoses:  Strep pharyngitis     Discharge Instructions     Lots of fluids.  If does not improve in the next 24 hours, start the oral antibiotic. If you continue to worsen or not improve, see ENT or go to the ER.  Take care  Dr. Adriana Simas    ED Prescriptions    Medication Sig Dispense Auth. Provider   cefdinir (OMNICEF) 300 MG capsule Take 1 capsule (300 mg total) by mouth 2 (two) times daily. 20 capsule Tommie Sams, DO     PDMP not reviewed this encounter.   Tommie Sams, [place] 02/22/20 1610

## 2020-02-22 NOTE — ED Triage Notes (Signed)
Patient states that she was diagnosed with strep throat on Wed.  Patient states that she is still having pain in her throat and does not want to go back to work today.  Patient states that she needs another work note.  Patient denies recent fevers.

## 2020-02-24 ENCOUNTER — Telehealth: Payer: Self-pay | Admitting: Family Medicine

## 2020-02-24 MED ORDER — FLUCONAZOLE 150 MG PO TABS: 150.0000 mg | ORAL_TABLET | Freq: Once | ORAL | 0 refills | Status: AC

## 2020-02-24 NOTE — Telephone Encounter (Signed)
Requesting Diflucan. Rx sent.  Everlene Other DO [facility]

## 2020-10-28 LAB — HM PAP SMEAR: HM Pap smear: NORMAL

## 2020-12-26 DIAGNOSIS — E559 Vitamin D deficiency, unspecified: Secondary | ICD-10-CM | POA: Insufficient documentation

## 2021-01-16 DIAGNOSIS — K912 Postsurgical malabsorption, not elsewhere classified: Secondary | ICD-10-CM | POA: Insufficient documentation

## 2021-01-19 ENCOUNTER — Ambulatory Visit: Payer: Self-pay | Admitting: Nurse Practitioner

## 2021-01-24 ENCOUNTER — Ambulatory Visit
Admission: EM | Admit: 2021-01-24 | Discharge: 2021-01-24 | Disposition: A | Discharge status: Home / Self Care | Payer: 59 | Attending: Sports Medicine | Admitting: Sports Medicine

## 2021-01-24 ENCOUNTER — Other Ambulatory Visit: Payer: Self-pay

## 2021-01-24 DIAGNOSIS — H5712 Ocular pain, left eye: Secondary | ICD-10-CM | POA: Diagnosis not present

## 2021-01-24 DIAGNOSIS — H1032 Unspecified acute conjunctivitis, left eye: Secondary | ICD-10-CM

## 2021-01-24 DIAGNOSIS — H5789 Other specified disorders of eye and adnexa: Secondary | ICD-10-CM

## 2021-01-24 MED ORDER — ERYTHROMYCIN 5 MG/GM OP OINT: TOPICAL_OINTMENT | OPHTHALMIC | 0 refills | Status: DC

## 2021-01-24 NOTE — ED Provider Notes (Signed)
[facility]    CSN: [id number] Arrival date & time: 01/24/21  9326      History   Chief Complaint Chief Complaint  Patient presents with  . Eye Pain    HPI Krista Solis is a 30 y.o. female.   Patient is a pleasant 29 year old female who presents for evaluation of the above issue.  She reports that she had a little bit of eye irritation and thought it was her fake eyelashes and she remove them by plucking them as well as plucking some of her own eyelashes.  Subsequently irritated her eye a lot.  Colleagues at work indicated that her eye was quite red.  Given that she comes in today for evaluation.  There is some redness of the eye, mostly in the conjunctiva.  She has no sensitivity to light, no photophobia.  No vision changes.  This includes painful vision, double vision, or blurry vision.  She denies any fever shakes chills.  She does report that she has a crusty discharge out of that left eye that was worse this morning when she woke up.  No nausea vomiting or diarrhea.  No chest pain or shortness of breath.  No COVID symptoms.  No red flag signs or symptoms elicited on history.     Past Medical History:  Diagnosis Date  . Anxiety   . Asthma   . Medical history non-contributory   . PCOS (polycystic ovarian syndrome)     Patient Active Problem List   Diagnosis Date Noted  . Hepatitis C antibody test positive 11/07/2017  . Polycystic ovaries 11/07/2017  . Eczema 11/07/2017  . 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 Genital herpes 11/07/2017  . 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 BMI 50.0-59.9, adult (HCC) 06/12/2016  . Asthma 06/12/2016    Past Surgical History:  Procedure Laterality Date  . LAPAROSCOPIC GASTRIC SLEEVE RESECTION  2014  . VAGINAL DELIVERY  06/12/2016   Procedure: VAGINAL DELIVERY in OR;  Surgeon: Jaymes Graff, MD;  [facility];   Service: Obstetrics;;  Pt took to OR for C-section for failure to progress but due to delay in time after epidural dosing patient was repoisitoned and baby's head rotated to deliver vaginal once in the OR   . WISDOM TOOTH EXTRACTION      OB History    Gravida  1   Para  1   Term  1   Preterm      AB      Living  1     SAB      IAB      Ectopic      Multiple  0   Live Births  1            Home Medications    Prior to Admission medications   Medication Sig Start Date End Date Taking? Authorizing Provider  albuterol (PROVENTIL HFA;VENTOLIN HFA) 108 (90 Base) MCG/ACT inhaler Inhale 1 puff into the lungs every 6 (six) hours as needed for wheezing or shortness of breath.   Yes [provider]  lisdexamfetamine (VYVANSE) 70 MG capsule Take 70 mg by mouth daily.   Yes [provider]  Vitamin D, Ergocalciferol, (DRISDOL) 1.25 MG (50000 UNIT) CAPS capsule Take 1 capsule by mouth once a week. 01/09/21  Yes [provider]  cefdinir (OMNICEF) 300 MG capsule Take 1 capsule (300 mg total) by mouth 2 (two) times daily.  02/22/20   Tommie Sams, DO  ketorolac (TORADOL) 10 MG tablet Take 1 tablet (10 mg total) by mouth every 6 (six) hours as needed for moderate pain or severe pain. 02/20/20   Tommie Sams, DO  lidocaine (XYLOCAINE) 2 % solution Gargle 15 mL every 3 hours as needed. May swallow if desired. 02/20/20   Tommie Sams, DO  dicyclomine (BENTYL) 20 MG tablet Take 1 tablet (20 mg total) by mouth 2 (two) times daily as needed for spasms. 01/02/19 02/20/20  Verlee Monte, NP    Family History History reviewed. No pertinent family history.  Social History Social History   Tobacco Use  . Smoking status: Never Smoker  . Smokeless tobacco: Never Used  Substance Use Topics  . Alcohol use: No  . Drug use: No     Allergies   Latex and Morphine and related   Review of Systems Review of Systems  Constitutional: Negative for activity change,  diaphoresis, fatigue and fever.  HENT: Negative.  Negative for congestion, ear pain, sinus pain and sore throat.   Eyes: Positive for discharge, redness and itching. Negative for photophobia, pain and visual disturbance.  Respiratory: Negative.  Negative for cough, chest tightness, shortness of breath and wheezing.   Cardiovascular: Negative.  Negative for chest pain and palpitations.  Gastrointestinal: Negative.  Negative for abdominal pain.  Genitourinary: Negative.  Negative for dysuria.  Musculoskeletal: Negative.  Negative for back pain.  Skin: Negative.  Negative for color change, pallor, rash and wound.  Neurological: Negative.  Negative for dizziness, weakness and headaches.  All other systems reviewed and are negative.    Physical Exam Triage Vital Signs ED Triage Vitals  Enc Vitals Group     BP 01/24/21 0831 (!) 144/95     Pulse Rate 01/24/21 0831 88     Resp 01/24/21 0831 18     Temp 01/24/21 0831 98.3 F (36.8 C)     Temp Source 01/24/21 0831 Oral     SpO2 01/24/21 0831 100 %     Weight 01/24/21 0827 (!) 322 lb (146.1 kg)     Height --      Head Circumference --      Peak Flow --      Pain Score 01/24/21 0827 6     Pain Loc --      Pain Edu? --      Excl. in [place]? --    No data found.  Updated Vital Signs BP (!) 144/95 (BP Location: Left Arm)   Pulse 88   Temp 98.3 F (36.8 C) (Oral)   Resp 18   Wt (!) 146.1 kg   SpO2 100%   BMI 50.43 kg/m   Visual Acuity Right Eye Distance:   Left Eye Distance:   Bilateral Distance:    Right Eye Near:   Left Eye Near:    Bilateral Near:     Physical Exam Vitals and nursing note reviewed.  Constitutional:      General: She is not in acute distress.    Appearance: Normal appearance. She is not ill-appearing, toxic-appearing or diaphoretic.  HENT:     Head: Normocephalic and atraumatic.     Right Ear: Tympanic membrane normal.     Left Ear: Tympanic membrane normal.     Nose: Nose normal.     Mouth/Throat:      Mouth: Mucous membranes are moist.     Pharynx: No oropharyngeal exudate or posterior oropharyngeal erythema.  Eyes:  General: Lids are normal. Vision grossly intact. No scleral icterus.       Right eye: No discharge.        Left eye: Discharge present.    Extraocular Movements: Extraocular movements intact.     Right eye: Normal extraocular motion and no nystagmus.     Left eye: Normal extraocular motion and no nystagmus.     Conjunctiva/sclera:     Right eye: Right conjunctiva is not injected. No chemosis, exudate or hemorrhage.    Left eye: Left conjunctiva is injected. No chemosis, exudate or hemorrhage.    Pupils: Pupils are equal, round, and reactive to light.  Cardiovascular:     Rate and Rhythm: Normal rate and regular rhythm.     Pulses: Normal pulses.     Heart sounds: Normal heart sounds. No murmur heard. No friction rub. No gallop.   Pulmonary:     Effort: Pulmonary effort is normal. No respiratory distress.     Breath sounds: Normal breath sounds. No stridor. No wheezing, rhonchi or rales.  Musculoskeletal:     Cervical back: Normal range of motion and neck supple. No rigidity or tenderness.  Lymphadenopathy:     Cervical: No cervical adenopathy.  Skin:    General: Skin is warm and dry.     Capillary Refill: Capillary refill takes less than 2 seconds.     Findings: No bruising, erythema, lesion or rash.  Neurological:     General: No focal deficit present.     Mental Status: She is alert and oriented to person, place, and time.      UC Treatments / Results  Labs (all labs ordered are listed, but only abnormal results are displayed) Labs Reviewed - No data to display  EKG   Radiology No results found.  Procedures Procedures (including critical care time)  Medications Ordered in UC Medications - No data to display  Initial Impression / Assessment and Plan / UC Course  I have reviewed the triage vital signs and the nursing notes.  Pertinent labs &  imaging results that were available during my care of the patient were reviewed by me and considered in my medical decision making (see chart for details).  Clinical impression: Left eye irritation and itchiness with a crusty discharge.  Consistent with conjunctivitis.  No sensation of a foreign body so no corneal abrasion.  Treatment plan: 1.  The findings and treatment plan were discussed in detail with the patient.  Patient was in agreement. 2.  We discussed doing a fluorescein exam but given that she is not having any pain more discomfort and itchiness with discharge we elected not to do the fluorescein exam as I doubt she has any corneal involvement. 3.  We will treat her for a conjunctivitis.  A long discussion with her regarding the fact that there is bacterial, viral, and allergic.  Given that she has irritated the eye it seems consistent with bacterial conjunctivitis and I do not want to spread.  Will prescribe erythromycin eye ointment. 4.  Educational handouts provided. 5.  Tylenol or Motrin for any discomfort. 6.  Provided the name of Dr. Druscilla BrowniePorfilio over at [facility] if she has any problems she should give them a call. 7.  Work note not needed. 8.  Discharged from care in stable condition.  She will follow-up with us as needed.    Final Clinical Impressions(s) / UC Diagnoses   Final diagnoses:  Left eye pain  Eye irritation  Acute bacterial conjunctivitis  of left eye     Discharge Instructions     As we discussed, given that you do not feel as though there is a foreign body or scratch to your cornea we will not do a fluorescein examination today. I will treat you for a bacterial conjunctivitis which is also called pinkeye. Please use the ointment as directed. Do not wear any contact lenses. Do not wear fake eyelashes until your symptoms have fully resolved. Avoid irritating the eye, rubbing it, or scratching it.  Use a cold washcloth between antibiotic treatments. Once  your symptoms have resolved you can also use over-the-counter eye flushes that you can get at the local drugstore. I also gave you the name of a physician at [facility] if your symptoms were to worsen in any way please call them and they are very good about you being seen same day.  Certainly if you are having any difficulty then go to the emergency room and the ER doctor will call in the on-call doctor at [facility].   ED Prescriptions    None     PDMP not reviewed this encounter.   Delton See, MD 01/24/21 [phone]

## 2021-01-24 NOTE — ED Triage Notes (Signed)
Patient complains left eye redness, pain and irritated. States that this started yesterday. States that she thought her eyelashes were bothering her so she took off her fake ones and started to pluck the real ones. States that eye has continued to worsen.

## 2021-01-24 NOTE — Discharge Instructions (Addendum)
As we discussed, given that you do not feel as though there is a foreign body or scratch to your cornea we will not do a fluorescein examination today. I will treat you for a bacterial conjunctivitis which is also called pinkeye. Please use the ointment as directed. Do not wear any contact lenses. Do not wear fake eyelashes until your symptoms have fully resolved. Avoid irritating the eye, rubbing it, or scratching it.  Use a cold washcloth between antibiotic treatments. Once your symptoms have resolved you can also use over-the-counter eye flushes that you can get at the local drugstore. I also gave you the name of a physician at [facility] if your symptoms were to worsen in any way please call them and they are very good about you being seen same day.  Certainly if you are having any difficulty then go to the emergency room and the ER doctor will call in the on-call doctor at [facility].

## 2021-04-20 ENCOUNTER — Telehealth: Payer: Self-pay

## 2021-04-20 NOTE — Telephone Encounter (Signed)
Called pt to go over reason for upcoming visit and meds- pt is taking Vyvanse for ADD and "something for anxiety." I have explained to pt that Dr Yetta Barre does not prescribe ADD medications and does not prescribe chronic anxiety meds. Pt is going to keep her appt

## 2021-05-04 ENCOUNTER — Encounter: Payer: Self-pay | Admitting: Family Medicine

## 2021-05-04 ENCOUNTER — Ambulatory Visit (INDEPENDENT_AMBULATORY_CARE_PROVIDER_SITE_OTHER): Payer: 59 | Admitting: Family Medicine

## 2021-05-04 VITALS — BP 120/78 | HR 72 | Ht 67.0 in | Wt 329.0 lb

## 2021-05-04 DIAGNOSIS — Z7689 Persons encountering health services in other specified circumstances: Secondary | ICD-10-CM

## 2021-05-04 DIAGNOSIS — Z8669 Personal history of other diseases of the nervous system and sense organs: Secondary | ICD-10-CM | POA: Diagnosis not present

## 2021-05-04 DIAGNOSIS — Z8659 Personal history of other mental and behavioral disorders: Secondary | ICD-10-CM

## 2021-05-04 DIAGNOSIS — J4520 Mild intermittent asthma, uncomplicated: Secondary | ICD-10-CM | POA: Diagnosis not present

## 2021-05-04 DIAGNOSIS — N879 Dysplasia of cervix uteri, unspecified: Secondary | ICD-10-CM | POA: Diagnosis not present

## 2021-05-04 MED ORDER — ALBUTEROL SULFATE HFA 108 (90 BASE) MCG/ACT IN AERS: 1.0000 | INHALATION_SPRAY | Freq: Four times a day (QID) | RESPIRATORY_TRACT | 6 refills | Status: AC | PRN

## 2021-05-04 NOTE — Progress Notes (Signed)
Date:  05/04/2021   Name:  Krista Solis   DOB:  [birth year]   MRN:  [id number]   Chief Complaint: Establish Care  Patient is a 30 year old female who presents for a comprehensive physical exam. The patient reports the following problems: none. Health maintenance has been reviewed up to date.     Lab Results  Component Value Date   CREATININE 0.77 01/02/2019   BUN 17 01/02/2019   NA 135 01/02/2019   K 4.1 01/02/2019   CL 105 01/02/2019   CO2 25 01/02/2019   No results found for: CHOL, HDL, LDLCALC, LDLDIRECT, TRIG, CHOLHDL No results found for: TSH No results found for: HGBA1C Lab Results  Component Value Date   WBC 4.6 01/02/2019   HGB 12.4 01/02/2019   HCT 38.7 01/02/2019   MCV 86.6 01/02/2019   PLT 262 01/02/2019   Lab Results  Component Value Date   ALT 19 01/02/2019   AST 25 01/02/2019   ALKPHOS 69 01/02/2019   BILITOT 0.6 01/02/2019     Review of Systems  Constitutional:  Negative for chills and fever.  HENT:  Negative for drooling, ear discharge, ear pain and sore throat.   Respiratory:  Positive for wheezing. Negative for cough and shortness of breath.   Cardiovascular:  Negative for chest pain, palpitations and leg swelling.  Gastrointestinal:  Negative for abdominal pain, blood in stool, constipation, diarrhea and nausea.  Endocrine: Negative for polydipsia.  Genitourinary:  Negative for dysuria, frequency, hematuria and urgency.  Musculoskeletal:  Negative for back pain, myalgias and neck pain.  Skin:  Negative for rash.  Allergic/Immunologic: Negative for environmental allergies.  Neurological:  Negative for dizziness and headaches.  Hematological:  Does not bruise/bleed easily.  Psychiatric/Behavioral:  Negative for suicidal ideas. The patient is not nervous/anxious.    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   BMI 50.0-59.9, adult (HCC) 06/12/2016   Asthma 06/12/2016    Allergies  Allergen Reactions   Latex Swelling   Morphine And Related Nausea And Vomiting    Past Surgical History:  Procedure Laterality Date   LAPAROSCOPIC GASTRIC SLEEVE RESECTION  2014   VAGINAL DELIVERY  06/12/2016   Procedure: VAGINAL DELIVERY in OR;  Surgeon: Jaymes Graff, MD;  Location: [facility];  Service: Obstetrics;;  Pt took to OR for C-section for failure to progress but due to delay in time after epidural dosing patient was repoisitoned and baby's head rotated to deliver vaginal once in the OR    WISDOM TOOTH EXTRACTION      Social History   Tobacco Use   Smoking status: Never   Smokeless tobacco: Never  Substance Use Topics   Alcohol use: Yes    Comment: socially   Drug use: No     Medication list has been reviewed and updated.  Current Meds  Medication Sig   albuterol (PROVENTIL HFA;VENTOLIN HFA) 108 (90 Base) MCG/ACT inhaler Inhale 1 puff into the lungs every 6 (six) hours as needed for wheezing or shortness of breath.   lisdexamfetamine (VYVANSE) 70 MG capsule Take 70 mg by mouth daily.    PHQ 2/9 Scores 05/04/2021 11/08/2017  PHQ - 2 Score 0 0  PHQ-  9 Score 0 -    GAD 7 : Generalized Anxiety Score 05/04/2021  Nervous, Anxious, on Edge 1  Control/stop worrying 0  Worry too much - different things 0  Trouble relaxing 0  Restless 0  Easily annoyed or irritable 0  Afraid - awful might happen 0  Total GAD 7 Score 1    BP Readings from Last 3 Encounters:  05/04/21 120/78  01/24/21 (!) 144/95  02/22/20 (!) 134/92    Physical Exam Vitals and nursing note reviewed.  Constitutional:      Appearance: She is well-developed.  HENT:     Head: Normocephalic.     Right Ear: Tympanic membrane and external  ear normal.     Left Ear: Tympanic membrane, ear canal and external ear normal.     Nose: Nose normal. No congestion or rhinorrhea.  Eyes:     General: Lids are everted, no foreign bodies appreciated. No scleral icterus.       Left eye: No foreign body or hordeolum.     Conjunctiva/sclera: Conjunctivae normal.     Right eye: Right conjunctiva is not injected.     Left eye: Left conjunctiva is not injected.     Pupils: Pupils are equal, round, and reactive to light.  Neck:     Thyroid: No thyromegaly.     Vascular: No JVD.     Trachea: No tracheal deviation.  Cardiovascular:     Rate and Rhythm: Normal rate and regular rhythm.     Chest Wall: PMI is not displaced.     Pulses: Normal pulses.     Heart sounds: Normal heart sounds, S1 normal and S2 normal. No murmur heard. No systolic murmur is present.  No diastolic murmur is present.    No friction rub. No gallop. No S3 or S4 sounds.  Pulmonary:     Effort: Pulmonary effort is normal. No respiratory distress.     Breath sounds: Normal breath sounds. No stridor. No decreased breath sounds, wheezing, rhonchi or rales.  Abdominal:     General: Bowel sounds are normal.     Palpations: Abdomen is soft. There is no hepatomegaly, splenomegaly or mass.     Tenderness: There is no abdominal tenderness. There is no guarding or rebound.  Musculoskeletal:        General: No tenderness. Normal range of motion.     Cervical back: Normal range of motion and neck supple.  Lymphadenopathy:     Cervical: No cervical adenopathy.  Skin:    General: Skin is warm.     Findings: No rash.  Neurological:     Mental Status: She is alert and oriented to person, place, and time.     Cranial Nerves: No cranial nerve deficit.     Deep Tendon Reflexes: Reflexes normal.  Psychiatric:        Mood and Affect: Mood is not anxious or depressed.    Wt Readings from Last 3 Encounters:  05/04/21 (!) 329 lb (149.2 kg)  01/24/21 (!) 322 lb (146.1 kg)  02/22/20  (!) 315 lb 0.6 oz (142.9 kg)    BP 120/78   Pulse 72   Ht 5\' 7"  (1.702 m)   Wt (!) 329 lb (149.2 kg)   BMI 51.53 kg/m   Assessment and Plan:  1. Establishing care with new doctor, encounter for Patient establishing care with new physician.  Reviewed previous encounters most recent labs most recent imaging in Care Everywhere.  2. Mild intermittent asthma without complication  Chronic.  Mild intermittent uncomplicated.  Refill albuterol 1 to 2 puffs as needed for wheezing.  Discussed with patient the possible use of chamber. - albuterol (VENTOLIN HFA) 108 (90 Base) MCG/ACT inhaler; Inhale 1 puff into the lungs every 6 (six) hours as needed for wheezing or shortness of breath. Include spacer  Dispense: 18 g; Refill: 6  3. History of migraine History of migraines followed by neurology patient is presently on Maxalt.  4. Cervical intraepithelial neoplasia (CIN) History of abnormal Paps followed by GYN and [facility] patient will continue.  5. History of ADHD Patient with history of ADHD will continue with present physician as for prescribing Vyvanse.

## 2021-07-02 ENCOUNTER — Other Ambulatory Visit: Payer: Self-pay

## 2021-07-02 ENCOUNTER — Encounter: Payer: Self-pay | Admitting: Emergency Medicine

## 2021-07-02 ENCOUNTER — Ambulatory Visit
Admission: EM | Admit: 2021-07-02 | Discharge: 2021-07-02 | Disposition: A | Discharge status: Home / Self Care | Payer: 59

## 2021-07-02 DIAGNOSIS — R051 Acute cough: Secondary | ICD-10-CM | POA: Diagnosis not present

## 2021-07-02 DIAGNOSIS — T7840XA Allergy, unspecified, initial encounter: Secondary | ICD-10-CM | POA: Diagnosis not present

## 2021-07-02 MED ORDER — PROMETHAZINE-DM 6.25-15 MG/5ML PO SYRP: 5.0000 mL | ORAL_SOLUTION | Freq: Every evening | ORAL | 0 refills | Status: DC

## 2021-07-02 NOTE — Discharge Instructions (Addendum)
-  Your cough could be due to either viral illness or allergies.  I have sent a cough medication for you to take at night. You can take other cough medicine during the day or your regular allergy medication.  Make sure to increase rest and fluids.  You should be seen again for the cough if you develop a fever, chest pain or increased breathing difficulty.

## 2021-07-02 NOTE — ED Triage Notes (Signed)
Pt c/o cough. Started about a week ago. She has seasonal allergies. She has started taking her allergy medications without relief. She is concerned because she is scheduled for gastric bypass on Monday. Covid test negative.

## 2021-07-02 NOTE — ED Provider Notes (Signed)
[facility]    CSN: [id number] Arrival date & time: 07/02/21  1813      History   Chief Complaint Chief Complaint  Patient presents with   Cough    HPI Krista Solis is a 30 y.o. female presenting for approximately 1 week history of dry cough.  She also admits to some nasal congestion and sneezing.  Patient has history of seasonal allergies.  She has been taking over-the-counter antihistamines without relief.  Patient states she has upcoming gastric bypass in a few days.  She did take a COVID test at home which was negative.  Patient denies any fever, chest tightness or breathing difficulty.  Also has history of asthma.  Has not needed to use inhalers for any acute shortness of breath.  No vomiting or diarrhea.  No other sick contacts and no known exposure to COVID-19.  Patient denies any worsening of symptoms but says she has not really improved.  No other complaints.  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   BMI 50.0-59.9, adult (HCC) 06/12/2016   Asthma 06/12/2016    Past Surgical History:  Procedure Laterality Date   LAPAROSCOPIC GASTRIC SLEEVE RESECTION  2014   VAGINAL DELIVERY  06/12/2016   Procedure: VAGINAL DELIVERY in OR;  Surgeon: Jaymes Graff, MD;  Location: [facility];  Service: Obstetrics;;  Pt took to OR for C-section for failure to progress but due to delay in time after epidural dosing patient was repoisitoned and baby's head rotated to deliver vaginal once in the OR    WISDOM TOOTH EXTRACTION       OB History     Gravida  1   Para  1   Term  1   Preterm      AB      Living  1      SAB      IAB      Ectopic      Multiple  0   Live Births  1            Home Medications    Prior to Admission medications   Medication Sig Start Date End Date Taking? Authorizing Provider  albuterol (VENTOLIN HFA) 108 (90 Base) MCG/ACT inhaler Inhale 1 puff into the lungs every 6 (six) hours as needed for wheezing or shortness of breath. Include spacer 05/04/21  Yes Duanne Limerick, MD  ergocalciferol (VITAMIN D2) 1.25 MG (50000 UT) capsule Take by mouth. 12/26/20  Yes [provider]  Levonorgestrel 13.5 MG IUD by Intrauterine route.   Yes [provider]  lisdexamfetamine (VYVANSE) 70 MG capsule Take 70 mg by mouth daily.   Yes [provider]  promethazine-dextromethorphan (PROMETHAZINE-DM) 6.25-15 MG/5ML syrup Take 5 mLs by mouth at bedtime. 07/02/21  Yes Shirlee Latch, PA-C  dicyclomine (BENTYL) 20 MG tablet Take 1 tablet (20 mg total) by mouth 2 (two) times daily as needed for spasms. 01/02/19 02/20/20  Verlee Monte, NP    Family History No family history on file.  Social History Social History   Tobacco Use   Smoking status: Never   Smokeless tobacco: Never  Vaping Use   Vaping Use: Never used  Substance Use Topics   Alcohol use: Yes    Comment: socially   Drug use: No     Allergies   Latex and Morphine and related   Review of Systems Review of Systems  Constitutional:  Negative for chills, diaphoresis, fatigue and fever.  HENT:  Positive for congestion, rhinorrhea and sneezing. Negative for ear pain, sinus pressure, sinus pain and sore throat.   Respiratory:  Positive for cough. Negative for shortness of breath.   Gastrointestinal:  Negative for abdominal pain, nausea and vomiting.  Musculoskeletal:  Negative for arthralgias and myalgias.  Skin:  Negative for rash.  Neurological:  Negative for weakness and headaches.   Hematological:  Negative for adenopathy.    Physical Exam Triage Vital Signs ED Triage Vitals  Enc Vitals Group     BP 07/02/21 1927 (!) 108/91     Pulse Rate 07/02/21 1927 81     Resp 07/02/21 1927 18     Temp 07/02/21 1927 98.1 F (36.7 C)     Temp Source 07/02/21 1927 Oral     SpO2 07/02/21 1927 98 %     Weight 07/02/21 1925 (!) 328 lb 14.8 oz (149.2 kg)     Height 07/02/21 1925 5\' 7"  (1.702 m)     Head Circumference --      Peak Flow --      Pain Score 07/02/21 1925 0     Pain Loc --      Pain Edu? --      Excl. in GC? --    No data found.  Updated Vital Signs BP (!) 108/91 (BP Location: Left Arm)   Pulse 81   Temp 98.1 F (36.7 C) (Oral)   Resp 18   Ht 5\' 7"  (1.702 m)   Wt (!) 328 lb 14.8 oz (149.2 kg)   SpO2 98%   BMI 51.52 kg/m       Physical Exam Vitals and nursing note reviewed.  Constitutional:      General: She is not in acute distress.    Appearance: Normal appearance. She is obese. She is not ill-appearing or toxic-appearing.  HENT:     Head: Normocephalic and atraumatic.     Nose: Congestion present.     Mouth/Throat:     Mouth: Mucous membranes are moist.     Pharynx: Oropharynx is clear.  Eyes:     General: No scleral icterus.       Right eye: No discharge.        Left eye: No discharge.     Conjunctiva/sclera: Conjunctivae normal.  Cardiovascular:     Rate and Rhythm: Normal rate and regular rhythm.     Heart sounds: Normal heart sounds.  Pulmonary:     Effort: Pulmonary effort is normal. No respiratory distress.     Breath sounds: Normal breath sounds.  Musculoskeletal:     Cervical back: Neck supple.  Skin:    General: Skin is dry.  Neurological:     General: No focal deficit present.     Mental Status: She is alert. Mental status is at baseline.     Motor: No weakness.     Gait: Gait normal.  Psychiatric:        Mood and Affect: Mood normal.        Behavior: Behavior normal.  Thought Content: Thought content normal.      UC Treatments / Results  Labs (all labs ordered are listed, but only abnormal results are displayed) Labs Reviewed  POCT URINALYSIS DIP (DEVICE) - Abnormal; Notable for the following components:      Result Value   Leukocytes,Ua TRACE (*)    All other components within normal limits    EKG   Radiology No results found.  Procedures Procedures (including critical care time)  Medications Ordered in UC Medications - No data to display  Initial Impression / Assessment and Plan / UC Course  I have reviewed the triage vital signs and the nursing notes.  Pertinent labs & imaging results that were available during my care of the patient were reviewed by me and considered in my medical decision making (see chart for details).  30 year old female presenting for cough, congestion and sneezing x1 week.  Has history of allergies and feels symptoms are consistent with that.  Admits to difficulty sleeping at night due to the cough and postnasal drainage.  Vitals stable on exam.  She is overall well-appearing.  Mild nasal congestion on exam.  Chest clear to auscultation heart regular rate and rhythm.  Of note, a urinalysis was incorrectly ordered on patient which was supposed to be ordered on her daughter.  Message sent to have this removed from chart.  Symptoms consistent with allergies at this time.  I have sent Promethazine DM for her to take at nighttime for the cough.  Advised continuing daily antihistamines and increasing rest and fluids and using Flonase.  Reviewed return and ED precautions.  Advised if feeling any worse on Monday to let her surgeon know.  Final Clinical Impressions(s) / UC Diagnoses   Final diagnoses:  Acute cough  Allergy, initial encounter     Discharge Instructions      -Your cough could be due to either viral illness or allergies.  I have sent a cough medication for you to take at night. You can take other cough medicine during the day or your regular  allergy medication.  Make sure to increase rest and fluids.  You should be seen again for the cough if you develop a fever, chest pain or increased breathing difficulty.     ED Prescriptions     Medication Sig Dispense Auth. Provider   promethazine-dextromethorphan (PROMETHAZINE-DM) 6.25-15 MG/5ML syrup Take 5 mLs by mouth at bedtime. 118 mL Shirlee Latch, PA-C      PDMP not reviewed this encounter.   Shirlee Latch, PA-C 07/06/21 [phone]

## 2021-07-03 LAB — POCT URINALYSIS DIP (DEVICE)
Bilirubin Urine: NEGATIVE
Glucose, UA: NEGATIVE mg/dL
Hgb urine dipstick: NEGATIVE
Ketones, ur: NEGATIVE mg/dL
Nitrite: NEGATIVE
Protein, ur: NEGATIVE mg/dL
Specific Gravity, Urine: 1.015 (ref 1.005–1.030)
Urobilinogen, UA: 0.2 mg/dL (ref 0.0–1.0)
pH: 7 (ref 5.0–8.0)

## 2021-08-12 DIAGNOSIS — Z9884 Bariatric surgery status: Secondary | ICD-10-CM | POA: Insufficient documentation

## 2021-08-23 ENCOUNTER — Other Ambulatory Visit: Payer: Self-pay | Admitting: Family Medicine

## 2021-08-23 DIAGNOSIS — J4520 Mild intermittent asthma, uncomplicated: Secondary | ICD-10-CM

## 2021-08-24 NOTE — Telephone Encounter (Signed)
Requested medications are due for refill today NO  Requested medications are on the active medication list yes  Last visit 05/04/21  Future visit scheduled no  Notes to clinic has rx from 8/8 with 6 refills, Pharmacy requesting new rx due to Proair discontinued. Please assess. Requested Prescriptions  Pending Prescriptions Disp Refills   PROAIR HFA 108 (90 Base) MCG/ACT inhaler [Pharmacy Med Name: PROAIR HFA          AER] 9 g 6    Sig: INHALE 1 PUFF BY MOUTH EVERY 6 HOURS AS NEEDED FOR WHEEZING OR  SHORTNESS  OF  BREATH     Pulmonology:  Beta Agonists Failed - 08/23/2021  4:19 PM      Failed - One inhaler should last at least one month. If the patient is requesting refills earlier, contact the patient to check for uncontrolled symptoms.      Passed - Valid encounter within last 12 months    Recent Outpatient Visits           3 months ago Establishing care with new doctor, encounter for   [facility] Duanne Limerick, MD

## 2021-11-05 ENCOUNTER — Encounter: Payer: Self-pay | Admitting: Emergency Medicine

## 2021-11-05 ENCOUNTER — Other Ambulatory Visit: Payer: Self-pay

## 2021-11-05 ENCOUNTER — Ambulatory Visit
Admission: EM | Admit: 2021-11-05 | Discharge: 2021-11-05 | Disposition: A | Discharge status: Home / Self Care | Payer: Medicaid Other | Attending: Emergency Medicine | Admitting: Emergency Medicine

## 2021-11-05 DIAGNOSIS — J029 Acute pharyngitis, unspecified: Secondary | ICD-10-CM | POA: Insufficient documentation

## 2021-11-05 DIAGNOSIS — R0981 Nasal congestion: Secondary | ICD-10-CM | POA: Insufficient documentation

## 2021-11-05 LAB — GROUP A STREP BY PCR: Group A Strep by PCR: NOT DETECTED

## 2021-11-05 MED ORDER — CETIRIZINE HCL 10 MG PO TABS: 10.0000 mg | ORAL_TABLET | Freq: Every day | ORAL | 0 refills | Status: AC

## 2021-11-05 MED ORDER — GUAIFENESIN ER 600 MG PO TB12
600.0000 mg | ORAL_TABLET | Freq: Two times a day (BID) | ORAL | 0 refills | Status: AC
Start: 2021-11-05 — End: ?

## 2021-11-05 MED ORDER — FLUTICASONE PROPIONATE 50 MCG/ACT NA SUSP: 1.0000 | Freq: Every day | NASAL | 1 refills | Status: AC

## 2021-11-05 MED ORDER — AMOXICILLIN-POT CLAVULANATE 875-125 MG PO TABS: 1.0000 | ORAL_TABLET | Freq: Two times a day (BID) | ORAL | 0 refills | Status: DC

## 2021-11-05 NOTE — ED Provider Notes (Signed)
[facility]    CSN: [id number] Arrival date & time: 11/05/21  0821      History   Chief Complaint Chief Complaint  Patient presents with   Sore Throat    HPI Krista Solis is a 31 y.o. female.   Patient presents with nasal congestion, nonproductive cough, rhinorrhea, sensation of ears being clogged bilaterally and sore throat for 14 days.  Was seen by PCP last week and placed on clindamycin for treatment of a sinus infection.  Patient endorses no improvement.  Endorses that 4 to 5 days ago she noticed Krista Solis patches on the back of her throat and it has been painful to swallow .  Tolerating food and liquids.  Has attempted use of over-the-counter cough drops, Chloraseptic spray and Tessalon Perles.  COVID and flu testing in PCP office negative.  Denies fever, chills, body aches, shortness of breath, wheezing, headaches.  History of asthma.     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   BMI 50.0-59.9, adult (HCC) 06/12/2016   Asthma 06/12/2016    Past Surgical History:  Procedure Laterality Date   LAPAROSCOPIC GASTRIC SLEEVE RESECTION  2014   VAGINAL DELIVERY  06/12/2016   Procedure: VAGINAL DELIVERY in OR;  Surgeon: Jaymes Graff, MD;  Location: [facility];  Service: Obstetrics;;  Pt took to OR for C-section for failure to progress but due to delay in time after epidural dosing patient was repoisitoned and baby's head rotated to deliver vaginal once in the OR    WISDOM TOOTH EXTRACTION      OB History     Gravida  1    Para  1   Term  1   Preterm      AB      Living  1      SAB      IAB      Ectopic      Multiple  0   Live Births  1            Home Medications    Prior to Admission medications   Medication Sig Start Date End Date Taking? Authorizing Provider  albuterol (VENTOLIN HFA) 108 (90 Base) MCG/ACT inhaler Inhale 1 puff into the lungs every 6 (six) hours as needed for wheezing or shortness of breath. Include spacer 05/04/21  Yes Duanne Limerick, MD  ergocalciferol (VITAMIN D2) 1.25 MG (50000 UT) capsule Take by mouth. 12/26/20  Yes [provider]  Levonorgestrel 13.5 MG IUD by Intrauterine route.   Yes [provider]  lisdexamfetamine (VYVANSE) 70 MG capsule Take 70 mg by mouth daily.   Yes [provider]  promethazine-dextromethorphan (PROMETHAZINE-DM) 6.25-15 MG/5ML syrup Take 5 mLs by mouth at bedtime. 07/02/21   Shirlee Latch, PA-C  dicyclomine (BENTYL) 20 MG tablet Take 1 tablet (20 mg total) by mouth 2 (two) times daily as needed for spasms. 01/02/19 02/20/20  Verlee Monte, NP    Family History History reviewed. No pertinent family history.  Social History Social History  Tobacco Use   Smoking status: Never   Smokeless tobacco: Never  Vaping Use   Vaping Use: Never used  Substance Use Topics   Alcohol use: Yes    Comment: socially   Drug use: No     Allergies   Latex and Morphine and related   Review of Systems Review of Systems  Constitutional: Negative.   HENT:  Positive for congestion, rhinorrhea and sore throat. Negative for dental problem, drooling, ear discharge, ear pain, facial swelling, hearing loss, mouth sores, nosebleeds, postnasal drip, sinus pressure, sinus pain, sneezing, tinnitus, trouble swallowing and voice change.   Respiratory:  Positive for cough. Negative for apnea, choking, chest tightness, shortness of breath, wheezing and stridor.   Cardiovascular: Negative.   Gastrointestinal: Negative.   Skin:  Negative.   Neurological: Negative.     Physical Exam Triage Vital Signs ED Triage Vitals  Enc Vitals Group     BP 11/05/21 0847 120/86     Pulse Rate 11/05/21 0847 91     Resp 11/05/21 0847 18     Temp 11/05/21 0847 98.4 F (36.9 C)     Temp Source 11/05/21 0847 Oral     SpO2 11/05/21 0847 99 %     Weight 11/05/21 0844 (!) 328 lb 14.8 oz (149.2 kg)     Height 11/05/21 0844 5\' 7"  (1.702 m)     Head Circumference --      Peak Flow --      Pain Score 11/05/21 0843 4     Pain Loc --      Pain Edu? --      Excl. in [place]? --    No data found.  Updated Vital Signs BP 120/86 (BP Location: Right Arm)    Pulse 91    Temp 98.4 F (36.9 C) (Oral)    Resp 18    Ht 5\' 7"  (1.702 m)    Wt (!) 328 lb 14.8 oz (149.2 kg)    SpO2 99%    BMI 51.52 kg/m   Visual Acuity Right Eye Distance:   Left Eye Distance:   Bilateral Distance:    Right Eye Near:   Left Eye Near:    Bilateral Near:     Physical Exam Constitutional:      Appearance: Normal appearance.  HENT:     Head: Normocephalic.     Right Ear: Tympanic membrane, ear canal and external ear normal.     Left Ear: Tympanic membrane, ear canal and external ear normal.     Nose: Congestion present.     Mouth/Throat:     Mouth: Mucous membranes are moist.     Pharynx: Oropharynx is clear.     Tonsils: No tonsillar exudate. 0 on the right. 0 on the left.  Eyes:     Extraocular Movements: Extraocular movements intact.  Cardiovascular:     Rate and Rhythm: Normal rate and regular rhythm.     Pulses: Normal pulses.     Heart sounds: Normal heart sounds.  Pulmonary:     Effort: Pulmonary effort is normal.     Breath sounds: Normal breath sounds.  Musculoskeletal:     Cervical back: Normal range of motion.  Lymphadenopathy:     Cervical: Cervical adenopathy present.  Skin:    General: Skin is warm and dry.  Neurological:     Mental Status: She is alert and oriented to person, place, and time. Mental status is at baseline.   Psychiatric:  Mood and Affect: Mood normal.        Behavior: Behavior normal.     UC Treatments / Results  Labs (all labs ordered are listed, but only abnormal results are displayed) Labs Reviewed - No data to display  EKG   Radiology No results found.  Procedures Procedures (including critical care time)  Medications Ordered in UC Medications - No data to display  Initial Impression / Assessment and Plan / UC Course  I have reviewed the triage vital signs and the nursing notes.  Pertinent labs & imaging results that were available during my care of the patient were reviewed by me and considered in my medical decision making (see chart for details).  Nasal congestion Sore throat  Strep PCR negative, discussed findings with patient, etiology of symptoms most likely related to congestion, discussed with patient, will trial Augmentin for 7-day course for treatment of sinus infection, discussed with patient that if symptoms persist past use of antibiotic and bacteria is not present and she will have to manage symptoms until they self resolve ,prescribed cetirizine, Mucinex and Flonase in addition for management of congestion, may attempt salt water gargles, throat lozenges warm teas, teaspoons of honey for additional comfort, may follow-up with urgent care as needed Final Clinical Impressions(s) / UC Diagnoses   Final diagnoses:  None   Discharge Instructions   None    ED Prescriptions   None    PDMP not reviewed this encounter.   Hans [place], [place] 11/05/21 [phone]

## 2021-11-05 NOTE — ED Triage Notes (Signed)
Pt sore throat. She states she was seen by her PCP last week and treated with clindamycin. She states she is still having a sore throat. She states she started goggling her symptoms and wanted to be seen again. She states she has white patches on the back of her throat.

## 2021-11-05 NOTE — Discharge Instructions (Addendum)
The Augmentin twice daily with food for 10 days for treatment of your sinusitis.  Perform sinus irrigation 2-3 times a day with a [employer] sinus rinse kit and distilled water.  Do not use tap water.  You can use  Mucinex twice daily   Increase your oral fluid intake to thin out your mucus so that is also able for your body to clear more easily.  Use the flonase nasal spray, daily for nasal and sinus congestion.  Begin use of daily Zyrtec to help reduce the amount of secretions present     If you develop any new or worsening symptoms return for reevaluation or see your primary care provider.

## 2022-01-20 ENCOUNTER — Ambulatory Visit
Admission: EM | Admit: 2022-01-20 | Discharge: 2022-01-20 | Disposition: A | Discharge status: Home / Self Care | Payer: Medicaid Other | Attending: Emergency Medicine | Admitting: Emergency Medicine

## 2022-01-20 DIAGNOSIS — J039 Acute tonsillitis, unspecified: Secondary | ICD-10-CM | POA: Diagnosis not present

## 2022-01-20 LAB — GROUP A STREP BY PCR: Group A Strep by PCR: NOT DETECTED

## 2022-01-20 MED ORDER — AMOXICILLIN 500 MG PO TABS: 500.0000 mg | ORAL_TABLET | Freq: Two times a day (BID) | ORAL | 0 refills | Status: AC

## 2022-01-20 MED ORDER — FLUCONAZOLE 150 MG PO TABS: 150.0000 mg | ORAL_TABLET | Freq: Once | ORAL | 1 refills | Status: AC

## 2022-01-20 NOTE — ED Triage Notes (Signed)
Patient is here for "Sore throat". Started Monday night. Fever "yesterday, up to 101". No new/unexplained rash since symptoms began. No runny nose. No cough. "I am sure I have strep".  ?

## 2022-01-20 NOTE — ED Provider Notes (Signed)
?HPI ? ?SUBJECTIVE: ? ?Patient reports sore throat and bilateral ear pain starting 3 days ago. Sx worse with swallowing, and when Tylenol wears off.  Sx better with Tylenol. Has been taking tea, salt water gargles and hydrogen peroxide rinses w/ o relief.  ?+ Fever tmax 101   ?No neck stiffness  ?No Cough ?No nasal congestion, rhinorrhea ?+ Myalgias ?No Headache ?No Rash ? ?No loss of taste or smell ?No shortness of breath or difficulty breathing ?No nausea, vomiting ?No diarrhea ?No abdominal pain ?    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 ?No Breathing difficulty, voice changes, sensation of throat swelling shut ?No Drooling ?No Trismus ?+ abx in past month was on clarithromycin for sinusitis.  ?+ antipyretic in past 4-6 hrs-Tylenol ?Past medical history of frequent strep.  She states that she gets it 4-5 times a year.  States this feels identical to previous episodes of strep throat. ?Past medical history of gastric bypass, asthma. ?LMP: End of March.  Has an IUD.  Denies possibility being pregnant ?PCP: [facility] ? ? ? ?Past Medical History:  ?Diagnosis Date  ? Anxiety   ? Asthma   ? Medical history non-contributory   ? PCOS (polycystic ovarian syndrome)   ? ? ?Past Surgical History:  ?Procedure Laterality Date  ? LAPAROSCOPIC GASTRIC SLEEVE RESECTION  2014  ? VAGINAL DELIVERY  06/12/2016  ? Procedure: VAGINAL DELIVERY in OR;  Surgeon: Jaymes Graff, MD;  Location: [facility];  Service: Obstetrics;;  Pt took to OR for C-section for failure to progress but due to delay in time after epidural dosing patient was repoisitoned and baby's head rotated to deliver vaginal once in the OR   ? WISDOM TOOTH EXTRACTION    ? ? ?History reviewed. No pertinent family history. ? ?Social History  ? ?Tobacco Use  ? Smoking status: Never  ? Smokeless tobacco: Never  ?Vaping Use  ?  Vaping Use: Never used  ?Substance Use Topics  ? Alcohol use: Yes  ?  Comment: socially  ? Drug use: No  ? ? ?No current facility-administered medications for this encounter. ? ?Current Outpatient Medications:  ?  Acetaminophen 325 MG CAPS, Tylenol, Disp: , Rfl:  ?  amoxicillin (AMOXIL) 500 MG tablet, Take 1 tablet (500 mg total) by mouth 2 (two) times daily for 10 days., Disp: 20 tablet, Rfl: 0 ?  busPIRone (BUSPAR) 7.5 MG tablet, Take 7.5 mg by mouth 2 (two) times daily., Disp: , Rfl:  ?  ergocalciferol (VITAMIN D2) 1.25 MG (50000 UT) capsule, Take by mouth., Disp: , Rfl:  ?  escitalopram (LEXAPRO) 10 MG tablet, , Disp: , Rfl:  ?  lisdexamfetamine (VYVANSE) 70 MG capsule, Vyvanse 70 mg capsule  TAKE 1 CAPSULE BY MOUTH ONCE DAILY, Disp: , Rfl:  ?  Multiple Vitamin (MULTI-VITAMINS) TABS, Take by mouth., Disp: , Rfl:  ?  omeprazole (PRILOSEC) 40 MG capsule, omeprazole 40 mg capsule,delayed release, Disp: , Rfl:  ?  ursodiol (ACTIGALL) 300 MG capsule, ursodiol 300 mg capsule, Disp: , Rfl:  ?  albuterol (PROAIR HFA) 108 (90 Base) MCG/ACT inhaler, ProAir HFA 90 mcg/actuation aerosol inhaler  INHALE 2 PUFFS BY MOUTH EVERY 4 HOURS AS NEEDED FOR 30 DAYS, Disp: , Rfl:  ?  albuterol (PROVENTIL) (2.5 MG/3ML) 0.083% nebulizer solution, , Disp: , Rfl:  ?  albuterol (VENTOLIN  HFA) 108 (90 Base) MCG/ACT inhaler, Inhale 1 puff into the lungs every 6 (six) hours as needed for wheezing or shortness of breath. Include spacer, Disp: 18 g, Rfl: 6 ?  aprepitant (EMEND) 40 MG capsule, aprepitant 40 mg capsule, Disp: , Rfl:  ?  bismuth subsalicylate (PEPTO BISMOL) 262 MG/15ML suspension, Take by mouth., Disp: , Rfl:  ?  calcium carbonate (OS-CAL) 1250 (500 Ca) MG chewable tablet, Chew by mouth., Disp: , Rfl:  ?  cetirizine (ZYRTEC ALLERGY) 10 MG tablet, Take 1 tablet (10 mg total) by mouth daily., Disp: 30 tablet, Rfl: 0 ?  Cholecalciferol 1.25 MG (50000 UT) capsule, cholecalciferol (vitamin D3) 1,250 mcg (50,000 unit) capsule  TAKE ONE  CAPSULE BY MOUTH ONE TIME PER WEEK, Disp: , Rfl:  ?  DRYSOL 20 % external solution, Apply topically daily., Disp: , Rfl:  ?  Eflornithine HCl (VANIQA) 13.9 % cream, Vaniqa 13.9 % topical cream  Apply 1 application twice a day by topical route as directed for 30 days., Disp: , Rfl:  ?  Emollient (CETAPHIL) cream, , Disp: , Rfl:  ?  Emollient (CETAPHIL) cream, Apply 1 application topically daily as needed (for eczema)., Disp: , Rfl:  ?  ergocalciferol (VITAMIN D2) 1.25 MG (50000 UT) capsule, Take by mouth., Disp: , Rfl:  ?  etonogestrel-ethinyl estradiol (NUVARING) 0.12-0.015 MG/24HR vaginal ring, Place vaginally., Disp: , Rfl:  ?  famotidine (PEPCID) 10 MG tablet, Take by mouth., Disp: , Rfl:  ?  FLUoxetine (PROZAC) 20 MG capsule, fluoxetine 20 mg capsule  TAKE 1 CAPSULE BY MOUTH ONCE DAILY, Disp: , Rfl:  ?  fluticasone (FLONASE) 50 MCG/ACT nasal spray, Place 1 spray into both nostrils daily., Disp: 9.9 mL, Rfl: 1 ?  fluticasone (FLONASE) 50 MCG/ACT nasal spray, fluticasone propionate 50 mcg/actuation nasal spray,suspension  USE 2 SPRAY(S) IN EACH NOSTRIL ONCE DAILY, Disp: , Rfl:  ?  guaiFENesin (MUCINEX) 600 MG 12 hr tablet, Take 1 tablet (600 mg total) by mouth 2 (two) times daily., Disp: 60 tablet, Rfl: 0 ?  Levonorgestrel 13.5 MG IUD, by Intrauterine route., Disp: , Rfl:  ?  lidocaine (LIDODERM) 5 %, lidocaine 5 % topical patch  APPLY 1 PATCH BY TOPICAL ROUTE ONCE DAILY (MAY WEAR UP TO 12HOURS.), Disp: , Rfl:  ?  metFORMIN (GLUCOPHAGE-XR) 500 MG 24 hr tablet, metformin ER 500 mg tablet,extended release 24 hr  TAKE TWO TABLETS BY MOUTH ONCE DAILY WITH EVENING MEAL. START WITH ONE TABLET DAILY INCREASE TO TWO TABLETS AFTER 2 WEEKS, Disp: , Rfl:  ?  methylphenidate 36 MG PO CR tablet, Take 72 mg by mouth every morning., Disp: , Rfl:  ?  metoCLOPramide (REGLAN) 10 MG tablet, Take by mouth., Disp: , Rfl:  ?  nystatin cream (MYCOSTATIN), nystatin 100,000 unit/gram topical cream, Disp: , Rfl:  ?  nystatin powder, Nystop  100,000 unit/gram topical powder, Disp: , Rfl:  ?  ondansetron (ZOFRAN) 8 MG tablet, ondansetron HCl 8 mg tablet, Disp: , Rfl:  ?  ondansetron (ZOFRAN-ODT) 8 MG disintegrating tablet, ondansetron 8 mg disintegrating tablet  TAKE 1 TABLET BY MOUTH EVERY 8 HOURS AS NEEDED FOR NAUSEA, Disp: , Rfl:  ?  rizatriptan (MAXALT) 10 MG tablet, rizatriptan 10 mg tablet  TAKE 1 TABLET BY MOUTH ONCE AS NEEDED FOR MIGRAINE. MAY REPEAT IN 2 HOURS IF NEEDED, Disp: , Rfl:  ?  spironolactone (ALDACTONE) 50 MG tablet, Take 50 mg by mouth daily., Disp: , Rfl:  ?  topiramate (TOPAMAX) 25 MG tablet, topiramate  25 mg tablet  TAKE 2 TABLETS BY MOUTH ONCE DAILY FOR 2 WEEKS AND THEN INCREASE TO 3 TABLETS ONCE DAILY, Disp: , Rfl:  ?  triamcinolone cream (KENALOG) 0.1 %, triamcinolone acetonide 0.1 % topical cream  APPLY CREAM EXTERNALLY TO AFFECTED AREA TWICE DAILY DO NOT USE FOR MORE THAN 5 CONSECUTIVE DAYS., Disp: , Rfl:  ?  triamcinolone cream (KENALOG) 0.1 %, SMARTSIG:1 Application Topical 2-3 Times Daily, Disp: , Rfl:  ?  triamcinolone ointment (KENALOG) 0.1 %, triamcinolone acetonide 0.1 % topical ointment  APPLY A THIN LAYER TO THE AFFECTED AREA(S) BY TOPICAL ROUTE 3 TIMES PER DAY, Disp: , Rfl:  ?  valACYclovir (VALTREX) 500 MG tablet, valacyclovir 500 mg tablet  TAKE 1 TABLET(S) BY MOUTH TWICE A DAY AS NEEDED FOR 5 DAYS., Disp: , Rfl:  ? ?Allergies  ?Allergen Reactions  ? Eggs Or Egg-Derived Products Shortness Of Breath and Swelling  ?  Egg Whites  ?  ? Latex Swelling  ? Morphine And Related Nausea And Vomiting  ? Morphine Nausea And Vomiting  ? ? ? ?ROS ? ?As noted in HPI.  ? ?Physical Exam ? ?BP 118/80 (BP Location: Left Arm)   Pulse 83   Temp 98.1 ?F (36.7 ?C) (Oral)   Resp 18   Ht 5\' 7"  (1.702 m)   Wt 120.2 kg   LMP 12/21/2021   SpO2 100%   BMI 41.50 kg/m?  ? ?Constitutional: Well developed, well nourished, no acute distress ?Eyes:  EOMI, conjunctiva normal bilaterally ?HENT: Normocephalic, atraumatic,mucus membranes moist.   No nasal congestion.  Erythematous oropharynx.  Enlarged tonsils with extensive exudates.  Positive petechiae on palate.  Foul-smelling breath.  Uvula midline.  TMs normal bilaterally. ?Respiratory: Normal

## 2022-01-20 NOTE — Discharge Instructions (Addendum)
Finish the amoxicillin, even if you feel better.  If you get a rash, discontinue the amoxicillin.  This is not an allergic reaction to the antibiotic, but a rash from mono.  1 gram of Tylenol 3-4 times a day as needed for pain.  Make sure you drink plenty of extra fluids.  Some people find salt water gargles and  Traditional Medicinal's "Throat Coat" tea helpful. Take 5 mL of liquid Benadryl and 5 mL of Maalox. Mix it together, and then hold it in your mouth for as long as you can and then swallow. You may do this 4 times a day.   ? ?Go to www.goodrx.com  or www.costplusdrugs.com to look up your medications. This will give you a list of where you can find your prescriptions at the most affordable prices. Or ask the pharmacist what the cash price is, or if they have any other discount programs available to help make your medication more affordable. This can be less expensive than what you would pay with insurance.   ?
